# Patient Record
Sex: Male | Born: 1970 | Race: White | Hispanic: No | Marital: Married | Smoking: Never smoker
Health system: Southern US, Community
[De-identification: ages and names within clinical notes are randomized; demographics above are authoritative.]

## PROBLEM LIST (undated history)

## (undated) DIAGNOSIS — K219 Gastro-esophageal reflux disease without esophagitis: Secondary | ICD-10-CM

## (undated) DIAGNOSIS — T783XXA Angioneurotic edema, initial encounter: Secondary | ICD-10-CM

## (undated) DIAGNOSIS — K228 Other specified diseases of esophagus: Secondary | ICD-10-CM

## (undated) DIAGNOSIS — T18108A Unspecified foreign body in esophagus causing other injury, initial encounter: Secondary | ICD-10-CM

## (undated) DIAGNOSIS — E785 Hyperlipidemia, unspecified: Secondary | ICD-10-CM

## (undated) DIAGNOSIS — K200 Eosinophilic esophagitis: Secondary | ICD-10-CM

## (undated) DIAGNOSIS — T7840XA Allergy, unspecified, initial encounter: Secondary | ICD-10-CM

## (undated) DIAGNOSIS — E119 Type 2 diabetes mellitus without complications: Secondary | ICD-10-CM

## (undated) DIAGNOSIS — I10 Essential (primary) hypertension: Secondary | ICD-10-CM

## (undated) DIAGNOSIS — J302 Other seasonal allergic rhinitis: Secondary | ICD-10-CM

## (undated) HISTORY — PX: UPPER GASTROINTESTINAL ENDOSCOPY: SHX188

## (undated) HISTORY — DX: Hyperlipidemia, unspecified: E78.5

## (undated) HISTORY — DX: Essential (primary) hypertension: I10

## (undated) HISTORY — PX: KNEE ARTHROSCOPY: SUR90

## (undated) HISTORY — DX: Gastro-esophageal reflux disease without esophagitis: K21.9

## (undated) HISTORY — DX: Allergy, unspecified, initial encounter: T78.40XA

## (undated) HISTORY — DX: Other seasonal allergic rhinitis: J30.2

## (undated) HISTORY — DX: Angioneurotic edema, initial encounter: T78.3XXA

## (undated) HISTORY — DX: Type 2 diabetes mellitus without complications: E11.9

## (undated) HISTORY — DX: Eosinophilic esophagitis: K20.0

## (undated) HISTORY — DX: Other specified diseases of esophagus: K22.8

## (undated) HISTORY — PX: ESOPHAGEAL DILATION: SHX303

---

## 2003-09-11 ENCOUNTER — Ambulatory Visit (HOSPITAL_BASED_OUTPATIENT_CLINIC_OR_DEPARTMENT_OTHER): Admission: RE | Admit: 2003-09-11 | Discharge: 2003-09-11 | Payer: Self-pay | Admitting: Internal Medicine

## 2012-11-22 ENCOUNTER — Emergency Department (HOSPITAL_BASED_OUTPATIENT_CLINIC_OR_DEPARTMENT_OTHER)
Admission: EM | Admit: 2012-11-22 | Discharge: 2012-11-23 | Disposition: A | Discharge status: Home / Self Care | Payer: 59 | Attending: Emergency Medicine | Admitting: Emergency Medicine

## 2012-11-22 ENCOUNTER — Encounter (HOSPITAL_BASED_OUTPATIENT_CLINIC_OR_DEPARTMENT_OTHER): Payer: Self-pay | Admitting: Emergency Medicine

## 2012-11-22 ENCOUNTER — Encounter (HOSPITAL_COMMUNITY)
Admission: EM | Disposition: A | Discharge status: Home / Self Care | Payer: Self-pay | Source: Home / Self Care | Attending: Emergency Medicine

## 2012-11-22 DIAGNOSIS — IMO0002 Reserved for concepts with insufficient information to code with codable children: Secondary | ICD-10-CM | POA: Insufficient documentation

## 2012-11-22 DIAGNOSIS — W449XXA Unspecified foreign body entering into or through a natural orifice, initial encounter: Secondary | ICD-10-CM | POA: Diagnosis present

## 2012-11-22 DIAGNOSIS — T18108A Unspecified foreign body in esophagus causing other injury, initial encounter: Secondary | ICD-10-CM | POA: Insufficient documentation

## 2012-11-22 DIAGNOSIS — K200 Eosinophilic esophagitis: Secondary | ICD-10-CM | POA: Diagnosis present

## 2012-11-22 DIAGNOSIS — R1310 Dysphagia, unspecified: Secondary | ICD-10-CM | POA: Insufficient documentation

## 2012-11-22 HISTORY — DX: Unspecified foreign body entering into or through a natural orifice, initial encounter: W44.9XXA

## 2012-11-22 HISTORY — DX: Unspecified foreign body in esophagus causing other injury, initial encounter: T18.108A

## 2012-11-22 HISTORY — PX: ESOPHAGOGASTRODUODENOSCOPY: SHX5428

## 2012-11-22 LAB — CBC WITH DIFFERENTIAL/PLATELET
Basophils Absolute: 0.1 10*3/uL (ref 0.0–0.1)
Basophils Relative: 1 % (ref 0–1)
Hemoglobin: 15.7 g/dL (ref 13.0–17.0)
Lymphocytes Relative: 36 % (ref 12–46)
Lymphs Abs: 4 10*3/uL (ref 0.7–4.0)
MCHC: 34.3 g/dL (ref 30.0–36.0)
Monocytes Absolute: 1 10*3/uL (ref 0.1–1.0)
Monocytes Relative: 9 % (ref 3–12)
Neutro Abs: 5.4 10*3/uL (ref 1.7–7.7)
Neutrophils Relative %: 49 % (ref 43–77)
RDW: 13.3 % (ref 11.5–15.5)
WBC: 11 10*3/uL — ABNORMAL HIGH (ref 4.0–10.5)

## 2012-11-22 LAB — BASIC METABOLIC PANEL
Chloride: 104 mEq/L (ref 96–112)
Creatinine, Ser: 1.1 mg/dL (ref 0.50–1.35)
GFR calc Af Amer: >90 mL/min (ref >90)
Glucose, Bld: 107 mg/dL — ABNORMAL HIGH (ref 70–99)
Potassium: 3.7 mEq/L (ref 3.5–5.1)
Sodium: 141 mEq/L (ref 135–145)

## 2012-11-22 SURGERY — EGD (ESOPHAGOGASTRODUODENOSCOPY)
Anesthesia: Moderate Sedation

## 2012-11-22 MED ORDER — BUTAMBEN-TETRACAINE-BENZOCAINE 2-2-14 % EX AERO
INHALATION_SPRAY | CUTANEOUS | Status: DC | PRN
  Administered 2012-11-22: 1 via TOPICAL

## 2012-11-22 MED ORDER — GLUCAGON HCL (RDNA) 1 MG IJ SOLR
1.0000 mg | Freq: Once | INTRAMUSCULAR | Status: AC
  Administered 2012-11-22: 1 mg via INTRAVENOUS
  Filled 2012-11-22: qty 1

## 2012-11-22 MED ORDER — NITROGLYCERIN 0.4 MG SL SUBL
0.4000 mg | SUBLINGUAL_TABLET | SUBLINGUAL | Status: DC | PRN
  Administered 2012-11-22: 0.4 mg via SUBLINGUAL
  Filled 2012-11-22 (×2): qty 25

## 2012-11-22 MED ORDER — DIPHENHYDRAMINE HCL 50 MG/ML IJ SOLN
INTRAMUSCULAR | Status: AC
  Filled 2012-11-22: qty 1

## 2012-11-22 MED ORDER — FENTANYL CITRATE 0.05 MG/ML IJ SOLN
INTRAMUSCULAR | Status: AC
  Filled 2012-11-22: qty 2

## 2012-11-22 MED ORDER — ONDANSETRON HCL 4 MG/2ML IJ SOLN
4.0000 mg | Freq: Once | INTRAMUSCULAR | Status: AC
  Administered 2012-11-22: 4 mg via INTRAVENOUS
  Filled 2012-11-22: qty 2

## 2012-11-22 MED ORDER — SODIUM CHLORIDE 0.9 % IV BOLUS (SEPSIS)
500.0000 mL | Freq: Once | INTRAVENOUS | Status: AC
  Administered 2012-11-22: 500 mL via INTRAVENOUS

## 2012-11-22 MED ORDER — FENTANYL CITRATE 0.05 MG/ML IJ SOLN
INTRAMUSCULAR | Status: DC | PRN
  Administered 2012-11-22 (×2): 25 ug via INTRAVENOUS

## 2012-11-22 MED ORDER — SODIUM CHLORIDE 0.9 % IV SOLN
INTRAVENOUS | Status: DC
  Administered 2012-11-22: 500 mL via INTRAVENOUS

## 2012-11-22 MED ORDER — MIDAZOLAM HCL 10 MG/2ML IJ SOLN
INTRAMUSCULAR | Status: AC
  Filled 2012-11-22: qty 2

## 2012-11-22 MED ORDER — MIDAZOLAM HCL 10 MG/2ML IJ SOLN
INTRAMUSCULAR | Status: DC | PRN
  Administered 2012-11-22 (×3): 2 mg via INTRAVENOUS
  Administered 2012-11-22: 1 mg via INTRAVENOUS

## 2012-11-22 NOTE — ED Notes (Signed)
Pt states he has a piece of chicken stuck in his throat, no resp diff. Pt is able to talk in complete sentences.

## 2012-11-22 NOTE — ED Notes (Signed)
Pt reports feeling "like I'm choking" and vomited small amount of saliva and mucus. Reports choking sensation eased but "i still feel the chicken in there"

## 2012-11-22 NOTE — ED Notes (Signed)
Attempted to drink water. Pt immediately began to vomit.  Pt was unable to keep water down.  Pt reports it feels it moved up a little bit still has pressure in chest/esophagus area.

## 2012-11-22 NOTE — H&P (Signed)
  [facility] History and Physical   Reason for Procedure: Treat food impaction  Recommendations: EGD and removal of food obstructing esophagus - possible dilation     HPI: Nathan Ramsey is a 42 y.o. male with hx of prior esophageal dilation about 10 years ago. He has had intermittent dysphagia. Tonight has had a piece of chicken lodged in esophagus and unable to swallow - secretions or anything. Mild chest discomfort. Not on PPI.   Past Medical History  Diagnosis Date  . Distal esophageal obstruction due to food 11/22/2012    Past Surgical History  Procedure Laterality Date  . Esophageal dilation      Prior to Admission medications   Not on File    Current Facility-Administered Medications  Medication Dose Route Frequency Provider Last Rate Last Dose  . 0.9 %  sodium chloride infusion   Intravenous Continuous Iva Boop, MD 20 mL/hr at 11/22/12 2339 500 mL at 11/22/12 2339  . nitroGLYCERIN (NITROSTAT) SL tablet 0.4 mg  0.4 mg Sublingual Q5 min PRN [name], MD   0.4 mg at 11/22/12 2120    Allergies as of 11/22/2012  . (No Known Allergies)    History reviewed. No pertinent family history.  History   Social History  . Marital Status: Married    Spouse Name: N/A    Number of Children: N/A  . Years of Education: N/A   Occupational History  . Not on file.   Social History Main Topics  . Smoking status: Never Smoker   . Smokeless tobacco: Not on file  . Alcohol Use: No  . Drug Use: No     Review of Systems: All other review of systems negative except as mentioned in the HPI.  Physical Exam: Vital signs in last 24 hours: Temp:  [98.1 F (36.7 C)] 98.1 F (36.7 C) (10/12 2022) Pulse Rate:  [93-106] 93 (10/12 2212) Resp:  [18-20] 20 (10/12 2119) BP: (163-174)/(92-106) 163/101 mmHg (10/12 2212) SpO2:  [98 %-100 %] 98 % (10/12 2212) Weight:  [205 lb (92.987 kg)] 205 lb (92.987 kg) (10/12 2022)   General:   Alert,   Well-developed, well-nourished, pleasant and cooperative in NAD Eyes:  Sclera clear, no icterus.   Conjunctiva pink. Lungs:  Clear throughout to auscultation.   Heart:  Regular rate and rhythm; no murmurs, clicks, rubs,  or gallops. Abdomen:  Soft, nontender and nondistended. No masses, hepatosplenomegaly or hernias noted. Normal bowel sounds, without guarding, and without rebound.   Neurologic:  Alert and  oriented x 3 Psych:  Alert and cooperative. Normal mood and affect.     Lab Results:  Recent Labs  11/22/12 2032  WBC 11.0*  HGB 15.7  HCT 45.8  PLT 308   BMET  Recent Labs  11/22/12 2032  NA 141  K 3.7  CL 104  CO2 26  GLUCOSE 107*  BUN 13  CREATININE 1.10  CALCIUM 9.6       LOS: 0 days      @Carl  Sena Slate, MD, [facility] [phone] (pager) 11/22/2012 11:42 PM@

## 2012-11-22 NOTE — ED Provider Notes (Signed)
CSN: [id number]     Arrival date & time 11/22/12  2014 History  This chart was scribed for Nathan Crease, MD by Greggory Stallion, ED Scribe. This patient was seen in room MH02/MH02 and the patient's care was started at 8:25 PM.   Chief Complaint  Patient presents with  . Foreign Body   The history is provided by the patient. No language interpreter was used.   HPI Comments: Nathan Ramsey is a 42 y.o. male who presents to the Emergency Department complaining of a piece of chicken being stuck in his esophagus that occurred about one hour ago. He states this has happened before but he has been able to clear it out by drinking water. Pt states he has h/o acid reflux and has had dilation done 10 years ago. Pt denies trouble breathing and SOB.   History reviewed. No pertinent past medical history. History reviewed. No pertinent past surgical history. History reviewed. No pertinent family history. History  Substance Use Topics  . Smoking status: Never Smoker   . Smokeless tobacco: Not on file  . Alcohol Use: No    Review of Systems  Respiratory: Negative for shortness of breath.   All other systems reviewed and are negative.    Allergies  Review of patient's allergies indicates no known allergies.  Home Medications  No current outpatient prescriptions on file.  BP 167/106  Pulse 102  Temp(Src) 98.1 F (36.7 C) (Oral)  Resp 18  Ht 5\' 10"  (1.778 m)  Wt 205 lb (92.987 kg)  BMI 29.41 kg/m2  SpO2 98%  Physical Exam  Constitutional: He is oriented to person, place, and time. He appears well-developed and well-nourished. No distress.  HENT:  Head: Normocephalic and atraumatic.  Right Ear: Hearing normal.  Left Ear: Hearing normal.  Nose: Nose normal.  Mouth/Throat: Oropharynx is clear and moist and mucous membranes are normal.  Eyes: Conjunctivae and EOM are normal. Pupils are equal, round, and reactive to light.  Neck: Normal range of motion. Neck supple.   Cardiovascular: Regular rhythm, S1 normal and S2 normal.  Exam reveals no gallop and no friction rub.   No murmur heard. Pulmonary/Chest: Effort normal and breath sounds normal. No respiratory distress. He exhibits no tenderness.  Abdominal: Soft. Normal appearance and bowel sounds are normal. There is no hepatosplenomegaly. There is no tenderness. There is no rebound, no guarding, no tenderness at McBurney's point and negative Murphy's sign. No hernia.  Musculoskeletal: Normal range of motion.  Neurological: He is alert and oriented to person, place, and time. He has normal strength. No cranial nerve deficit or sensory deficit. Coordination normal. GCS eye subscore is 4. GCS verbal subscore is 5. GCS motor subscore is 6.  Skin: Skin is warm, dry and intact. No rash noted. No cyanosis.  Psychiatric: He has a normal mood and affect. His speech is normal and behavior is normal. Thought content normal.    ED Course  Procedures (including critical care time)  DIAGNOSTIC STUDIES: Oxygen Saturation is 98% on RA, normal by my interpretation.    COORDINATION OF CARE: 8:27 PM-Discussed treatment plan which includes labs with pt at bedside and pt agreed to plan.   Labs Review Labs Reviewed  CBC WITH DIFFERENTIAL - Abnormal; Notable for the following:    WBC 11.0 (*)    Eosinophils Relative 6 (*)    All other components within normal limits  BASIC METABOLIC PANEL - Abnormal; Notable for the following:    Glucose, Bld 107 (*)  GFR calc non Af Amer 81 (*)    All other components within normal limits   Imaging Review No results found.  EKG Interpretation   None       MDM  Diagnosis: Esophageal impaction  Patient presents to ER with a sensation of a piece of chicken stuck in his esophagus. Patient reports that he had this happened approximately 10 years ago. At that time he required an esophageal dilatation. He has been doing well up until about a year ago when he started noticing  having trouble swallowing. He says he has had to be careful to chew his food completely, but this is the first time he has had anything stuck. The patient was evaluated, no respiratory difficulty. He is unable to swallow liquids here in the ER. Patient was administered nitroglycerin and glucagon without improvement. He is still having difficulty swallowing liquids including his saliva. This therefore discussed with GI on call, Doctor Leone Payor. Patient will be transferred for endoscopy.   I personally performed the services described in this documentation, which was scribed in my presence. The recorded information has been reviewed and is accurate.    Nathan Crease, MD 11/22/12 2155

## 2012-11-23 ENCOUNTER — Encounter (HOSPITAL_COMMUNITY): Payer: Self-pay | Admitting: Internal Medicine

## 2012-11-23 DIAGNOSIS — K200 Eosinophilic esophagitis: Secondary | ICD-10-CM

## 2012-11-23 DIAGNOSIS — T18108A Unspecified foreign body in esophagus causing other injury, initial encounter: Secondary | ICD-10-CM

## 2012-11-23 HISTORY — DX: Eosinophilic esophagitis: K20.0

## 2012-11-23 NOTE — Op Note (Signed)
[facility] [address] [place], [address]   ENDOSCOPY PROCEDURE REPORT  PATIENT: Nathan Ramsey, Nathan Ramsey  MR#: [id number] BIRTHDATE: 1970/06/25 , 42  yrs. old GENDER: Male ENDOSCOPIST: Iva Boop, MD, [facility] PROCEDURE DATE:  11/22/2012 PROCEDURE:  EGD w/ biopsy and EGD w/ fb removal ASA CLASS:     Class I INDICATIONS:  Therapeutic procedure.  Remove food obstructing esophagus. MEDICATIONS: Fentanyl 50 mcg IV and Versed 7 mg IV TOPICAL ANESTHETIC: Cetacaine Spray  DESCRIPTION OF PROCEDURE: After the risks benefits and alternatives of the procedure were thoroughly explained, informed consent was obtained.  The    endoscope was introduced through the mouth and advanced to the stomach antrum. Without limitations.  The instrument was slowly withdrawn as the mucosa was fully examined.     ESOPHAGUS: Food impaction in distal esophagus.  It was removed with gentle manipulation of the scope and pressure - advancing it into the stomach.  Slight trauma to underlying mucosa.   Eosinophilic esophagitis was suspected  with mucosal changes that included circumferential folds, longitudinal furrows and white spots were found in the entire esophagus.   Biopsies taken.  The remainder of the upper endoscopy exam was otherwise normal. Retroflexed views revealed no abnormalities.     The scope was then withdrawn from the patient and the procedure completed.  COMPLICATIONS: There were no complications. ENDOSCOPIC IMPRESSION: 1.   Food impaction in distal esophagus.  It was removed with gentle manipulation of the scope and pressure - advancing it into the stomach.  Slight trauma to underlying mucosa. 2.   Eosinophilic esophagitis was suspectedin the entire esophagus 3.   The remainder of the upper endoscopy exam was otherwise normal  RECOMMENDATIONS: 1.  Await pathology results 2.  PPI qam - OTC is ok 3.  Office will call with results/plans   eSigned:  Iva Boop, MD,  [facility] 11/23/2012 12:08 AM  CC:The Patient

## 2012-11-27 ENCOUNTER — Other Ambulatory Visit: Payer: Self-pay | Admitting: *Deleted

## 2012-11-27 MED ORDER — PANTOPRAZOLE SODIUM 40 MG PO TBEC
DELAYED_RELEASE_TABLET | ORAL | Status: DC
Start: 2012-11-27 — End: 2018-08-06

## 2012-11-27 NOTE — Progress Notes (Signed)
Quick Note:  Call patient - he has eosinophilic esophagitis - this is causing swallowing problem Needs REV 1-2 weeks to review and discuss Tx please He is on/should be OTC PPI but please Rx pantoprazole 40 mg qd before breakfast #30 11 refills to replace ______

## 2012-12-02 ENCOUNTER — Ambulatory Visit (INDEPENDENT_AMBULATORY_CARE_PROVIDER_SITE_OTHER): Payer: 59 | Admitting: Internal Medicine

## 2012-12-02 ENCOUNTER — Encounter: Payer: Self-pay | Admitting: Internal Medicine

## 2012-12-02 VITALS — BP 134/90 | HR 80 | Ht 70.0 in | Wt 210.2 lb

## 2012-12-02 DIAGNOSIS — K200 Eosinophilic esophagitis: Secondary | ICD-10-CM

## 2012-12-02 MED ORDER — AMBULATORY NON FORMULARY MEDICATION: Status: DC

## 2012-12-02 NOTE — Assessment & Plan Note (Addendum)
Reviewed nature of disease and treatment Stay on PPI Start viscous budesonide 1 mg bid Allergy referral RTC 2 months Gradually increase size of foods a s he feels better

## 2012-12-02 NOTE — Progress Notes (Signed)
  Subjective:    Patient ID: Nathan Ramsey, male    DOB: [birth year], 42 y.o.   MRN: [id number]  HPI The patient presents in followup. I met him on call the other night, he had a food impaction. Biopsies of the esophagus demonstrated eosinophilic esophagitis. He is on a dysphagia 3 diet and doing reasonably well, I also started him on a PPI. He relates that he had an upper endoscopy with dilation in the past and had improvement, he was on Nexium. He lost a lot of weight, and stop the Nexium and did not have heartburn anymore. However he developed intermittent and recurrent and more progressive solid food dysphagia culminating into this food impaction problem about 2 weeks ago. He denies any history of overt allergies other than seasonal allergies. No Known Allergies Outpatient Prescriptions Prior to Visit  Medication Sig Dispense Refill  . pantoprazole (PROTONIX) 40 MG tablet Take one daily before breakfast  30 tablet  11  Over-the-counter allergy pill    Past Medical History  Diagnosis Date  . Distal esophageal obstruction due to food 11/22/2012  . Eosinophilic esophagitis 11/23/2012   seasonal allergies Past Surgical History  Procedure Laterality Date  . Esophageal dilation    . Esophagogastroduodenoscopy N/A 11/22/2012    Procedure: ESOPHAGOGASTRODUODENOSCOPY (EGD);  Surgeon: Iva Boop, MD;  Location: [facility];  Service: Endoscopy;  Laterality: N/A;   History   Social History  . Marital Status: Married with children             .     Social History Main Topics  . Smoking status: Never Smoker   . Smokeless tobacco: None  . Alcohol Use: 2.5 - 3 oz/week    5-6 drink(s) per week  . Drug Use: No  .       Review of Systems As per history of present illness    Objective:   Physical Exam  Well-developed well-nourished middle-aged white man in no acute distress    Assessment & Plan:   1. Eosinophilic esophagitis

## 2012-12-02 NOTE — Patient Instructions (Addendum)
Please come back and see [place] in 2 months.  Today we have faxed a rx to your [facility], [employer] at [phone].  (rx for Viscous budesonide)  You have an appointment with Dr. Laurette Schimke in [place], they will mail you the details.  The appointment is 12/10/12 at 9:30am.  Per there office you need to hold your allergy medicines for 72 hours prior to your appointment. They are located at :[address] , [place] , phone # [phone], fax # [phone]  I appreciate the opportunity to care for you.

## 2012-12-04 ENCOUNTER — Encounter: Payer: Self-pay | Admitting: Internal Medicine

## 2012-12-04 ENCOUNTER — Other Ambulatory Visit: Payer: Self-pay

## 2012-12-04 MED ORDER — AMBULATORY NON FORMULARY MEDICATION: Status: DC

## 2012-12-04 NOTE — Progress Notes (Signed)
Patient ID: Nathan Ramsey, male   DOB: Dec 16, 1970, 42 y.o.   MRN: [id number] Faxed 12/02/12 office note to Dr. Laurette Schimke at fax # [phone] for his up coming office visit with them on 01/01/13 at 9 AM

## 2012-12-04 NOTE — Telephone Encounter (Signed)
Pharmacy called to inquire about a dose change due to how they compound this medicine there.  Doug Sou, PA-C spoke with them and ok'ed the change .

## 2012-12-17 ENCOUNTER — Other Ambulatory Visit: Payer: Self-pay

## 2013-01-26 ENCOUNTER — Ambulatory Visit (INDEPENDENT_AMBULATORY_CARE_PROVIDER_SITE_OTHER): Payer: 59 | Admitting: Internal Medicine

## 2013-01-26 ENCOUNTER — Encounter: Payer: Self-pay | Admitting: Internal Medicine

## 2013-01-26 VITALS — BP 136/90 | HR 80 | Ht 70.0 in | Wt 210.0 lb

## 2013-01-26 DIAGNOSIS — T783XXA Angioneurotic edema, initial encounter: Secondary | ICD-10-CM

## 2013-01-26 DIAGNOSIS — K200 Eosinophilic esophagitis: Secondary | ICD-10-CM

## 2013-01-26 HISTORY — DX: Angioneurotic edema, initial encounter: T78.3XXA

## 2013-01-26 NOTE — Assessment & Plan Note (Signed)
I recommend he followup with Dr. Lucie Leather about this. He will call for an appointment.

## 2013-01-26 NOTE — Assessment & Plan Note (Addendum)
Better and asymptomatic To discontinue viscous budesonide and continue daily PPI Routine return visit with me in 1 year

## 2013-01-26 NOTE — Patient Instructions (Signed)
Glad to hear the swallowing is better. Please follow-up with Dr. Lucie Leather about the throat swelling episode and allergies.  I plan to see you routinely in about 1 year.  I appreciate the opportunity to care for you. Iva Boop, MD, [facility]

## 2013-01-26 NOTE — Progress Notes (Signed)
         Subjective:    Patient ID: Nathan Ramsey, male    DOB: 1970/05/10, 42 y.o.   MRN: [id number]  HPI The patient returns for followup. He has a history of eosinophilic esophagitis diagnosed to 3 months ago. Since that time he has not had dysphagia using viscous budesonide and PPI therapy. Allergy evaluation did not reveal any clear food allergies though he might be allergic to wheat he was told. A couple of weeks ago around Thanksgiving holiday he awakened and was unable to talk or breathe through his mouth so he was able to breathe through his nose some. He went to the hospital in [place] where he was thought to have angioedema similar one episode he had in the past. He had his EpiPen but since he could still breathe through his nose he held off, he was treated with epinephrine and Solu-Medrol and For about 24-36 hours in the hospital. There was a question of whether or not he might not have hereditary angioedema or C1 esterase deficiency and lab studies with complement studies he brings are normal. He was told that he probably need one other test and that the allergist there would coordinate with Dr. Lucie Leather dear. The patient has not heard about that.  Otherwise doing well, plans to return to  for the Christmas holidays with his son finishes his lacrosse. Medications, allergies, past medical history, past surgical history, family history and social history are reviewed and updated in the EMR.   Review of Systems As above    Objective:   Physical Exam Well-developed well-nourished no acute distress    Assessment & Plan:   1. Eosinophilic esophagitis   2. Angioedema   I appreciate the opportunity to care for this patient.  CC: Almedia Balls, MD and Jessica Priest M.D.

## 2013-02-15 ENCOUNTER — Encounter (HOSPITAL_COMMUNITY): Payer: Self-pay | Admitting: Anesthesiology

## 2013-08-09 ENCOUNTER — Other Ambulatory Visit: Payer: Self-pay | Admitting: Dermatology

## 2013-11-30 ENCOUNTER — Encounter: Payer: Self-pay | Admitting: Internal Medicine

## 2014-02-15 ENCOUNTER — Encounter: Payer: Self-pay | Admitting: Internal Medicine

## 2014-02-15 ENCOUNTER — Ambulatory Visit (INDEPENDENT_AMBULATORY_CARE_PROVIDER_SITE_OTHER): Payer: 59 | Admitting: Internal Medicine

## 2014-02-15 VITALS — BP 126/90 | HR 88 | Ht 68.5 in | Wt 207.0 lb

## 2014-02-15 DIAGNOSIS — K200 Eosinophilic esophagitis: Secondary | ICD-10-CM

## 2014-02-15 DIAGNOSIS — K219 Gastro-esophageal reflux disease without esophagitis: Secondary | ICD-10-CM

## 2014-02-15 MED ORDER — OMEPRAZOLE-SODIUM BICARBONATE 20-1100 MG PO CAPS: 1.0000 | ORAL_CAPSULE | Freq: Every day | ORAL | Status: DC

## 2014-02-15 NOTE — Patient Instructions (Signed)
Take over the counter zegerid one at bedtime.   In 2 months MyChart Dr Leone PayorGessner with how your doing.   Follow up with Dr Leone PayorGessner in a year or sooner if needed.   I appreciate the opportunity to care for you. Stan Headarl Gessner, M.D., [facility]

## 2014-02-15 NOTE — Progress Notes (Signed)
   Subjective:    Patient ID: Nathan Ramsey, male    DOB: 1970-11-23, 44 y.o.   MRN: [id number]  HPI Patient is here for follow-up of eosinophilic esophagitis and probable GERD. He is not having any true food dysphagia but has a sensation like things are filling up in the back of his throat at times he has to clear it out. Almost like a globus sensation but it doesn't sound completely like that. He is not having any heartburn or reflux. Sometimes there is a cough no sore throat and no hoarseness. This does not tend to disturb his sleep. He has not had these problems before as part of his symptom complex. He is not aware of any particular allergies, he was tested for food allergies before and didn't have those, he does use an over-the-counter antihistamine and has had a history of an angioedema episode and uses an EpiPen as needed though he has not needed to do that.  Medications, allergies, past medical history, past surgical history, family history and social history are reviewed and updated in the EMR. Review of Systems As above    Objective:   Physical Exam Well-developed WM NAD There is mild posterior pharyngeal erythema but no other lesions    Assessment & Plan:   1. Eosinophilic esophagitis   2. Gastroesophageal reflux disease, esophagitis presence not specified    I'm not sure what going on here, it could be a manifestation of GERD with the symptoms of something filling up in his throat. It does not sound like typical eosinophilic esophagitis symptoms. Perhaps he said some atypical GERD problems with proximal reflux.  1. Trial of over-the-counter Zegerid 20 mg at bedtime 2. Report back to me through phone call or my chart in 2 months with a symptom update 3. If persistent symptoms I think ENT or allergy follow-up would be necessary. He would like to go to [facility] because his daughter started going there for allergy shots if he needs to see an allergist again. He was  previously evaluated for food allergies but not environmental allergies.  I appreciate the opportunity to care for this patient. CC: CABEZA,YURI, MD

## 2014-04-20 ENCOUNTER — Encounter: Payer: Self-pay | Admitting: Internal Medicine

## 2014-04-21 ENCOUNTER — Telehealth: Payer: Self-pay

## 2014-04-21 NOTE — Telephone Encounter (Signed)
Patient is scheduled for 05/18/14 1:45 with Dr. Irena CordsVan Winkle.  He is notified of the appt details.

## 2014-04-21 NOTE — Telephone Encounter (Signed)
-----   Message from Iva Booparl E Gessner, MD sent at 04/20/2014  4:58 PM EST ----- Regarding: allergy referral Patient needs an allergy referral to [facility](van Winkle, Sharma, etc) his daughter sees Irena Cordsvan Winkle and he requests that practice  Reason is eosinophilic esophagitis, throat and nasal symptoms ? Allergies  He has sen Dr. Lucie LeatherKozlow before and had food allergy testing in case they ask.

## 2015-06-19 ENCOUNTER — Ambulatory Visit (INDEPENDENT_AMBULATORY_CARE_PROVIDER_SITE_OTHER): Payer: 59 | Admitting: Internal Medicine

## 2015-06-19 ENCOUNTER — Encounter: Payer: Self-pay | Admitting: Internal Medicine

## 2015-06-19 ENCOUNTER — Ambulatory Visit (INDEPENDENT_AMBULATORY_CARE_PROVIDER_SITE_OTHER)
Admission: RE | Admit: 2015-06-19 | Discharge: 2015-06-19 | Disposition: A | Discharge status: Home / Self Care | Payer: 59 | Source: Ambulatory Visit | Attending: Internal Medicine | Admitting: Internal Medicine

## 2015-06-19 VITALS — BP 142/100 | HR 104 | Ht 68.5 in | Wt 216.4 lb

## 2015-06-19 DIAGNOSIS — R05 Cough: Secondary | ICD-10-CM

## 2015-06-19 DIAGNOSIS — IMO0001 Reserved for inherently not codable concepts without codable children: Secondary | ICD-10-CM

## 2015-06-19 DIAGNOSIS — R053 Chronic cough: Secondary | ICD-10-CM

## 2015-06-19 DIAGNOSIS — R221 Localized swelling, mass and lump, neck: Secondary | ICD-10-CM

## 2015-06-19 DIAGNOSIS — R030 Elevated blood-pressure reading, without diagnosis of hypertension: Secondary | ICD-10-CM | POA: Diagnosis not present

## 2015-06-19 DIAGNOSIS — K200 Eosinophilic esophagitis: Secondary | ICD-10-CM | POA: Diagnosis not present

## 2015-06-19 NOTE — Patient Instructions (Signed)
   You have been scheduled for an endoscopy. Please follow written instructions given to you at your visit today. If you use inhalers (even only as needed), please bring them with you on the day of your procedure.    Today please go to the basement and have a chest x-ray done.    I appreciate the opportunity to care for you.

## 2015-06-19 NOTE — Progress Notes (Signed)
Subjective:    Patient ID: Nathan Ramsey, male    DOB: July 27, 1970, 45 y.o.   MRN: [id number] Cc; f/u eosinophilic esophagitis HPI Here after allergy appointment early 2017 - has a few c/o  1) chronic slightly productive cough - " my wife is very concerned" 2) sensation of lump in throat, ? Phlegm, no post nasal gtt sxs 3) dysphagia x 1 1.5 months ago otherwise very rare if at all Review of Systems Father in hospital in [place] - has had bleeding ulcer and Cushings syndrome  No Known Allergies Outpatient Prescriptions Prior to Visit  Medication Sig Dispense Refill  . AMBULATORY NON FORMULARY MEDICATION Medication Name: OTC allergy Medication (Allertec)-Take 1 tablet PO daily    . EPIPEN 2-PAK 0.3 MG/0.3ML SOAJ injection     . pantoprazole (PROTONIX) 40 MG tablet Take one daily before breakfast 30 tablet 11  . Omeprazole-Sodium Bicarbonate (ZEGERID OTC) 20-1100 MG CAPS capsule Take 1 capsule by mouth at bedtime. 28 each 0  . rosuvastatin (CRESTOR) 20 MG tablet Take 20 mg by mouth daily.     No facility-administered medications prior to visit.   Past Medical History  Diagnosis Date  . Distal esophageal obstruction due to food 11/22/2012  . Eosinophilic esophagitis 11/23/2012  . Seasonal allergies   . 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 Esophageal dilation    . Esophagogastroduodenoscopy N/A 11/22/2012    Procedure: ESOPHAGOGASTRODUODENOSCOPY (EGD);  Surgeon: Iva Boop, MD;  Location: [facility];  Service: Endoscopy;  Laterality: N/A;  . Knee arthroscopy     Social History   Social History  . Marital Status: Married    Spouse Name: N/A  . Number of Children: 2  . Years of Education: N/A   Occupational History  . IT    Social History Main Topics  . Smoking status: Never Smoker   . Smokeless tobacco: Never Used  . Alcohol Use: 2.5 - 3.0 oz/week    5-6 drink(s) per week  . Drug Use: No  . Sexual Activity: Not Asked    Other Topics Concern  . None   Social History Narrative   Family History  Problem Relation Age of Onset  . Kidney cancer Daughter   . Melanoma Paternal Uncle   . Breast cancer Mother   . Cushing syndrome Father       Objective:   Physical Exam  142/100 mmHg  Pulse 104  Ht 5' 8.5" (1.74 m)  Wt 216 lb 6.4 oz (98.158 kg)  BMI 32.42 kg/m2@  General:  NAD Eyes:   Anicteric Pharynx:  Erythema - slight posterior Lungs:  clear Heart:: S1S2 no rubs, murmurs or gallops Abdomen:  soft and nontender, BS+ Ext:   no edema, cyanosis or clubbing    Data Reviewed:  Prior EGD Recent allergy notes 02/2015    Assessment & Plan:   Encounter Diagnoses  Name Primary?  . Eosinophilic esophagitis Yes  . Chronic cough   . Sensation of lump in throat   . Elevated blood pressure     EGD, ? Dilation - dysphagia is rare - needs reassessment of EoE - has been treated with viscous budesonide and ingested steroid inhaler- not in some time - cough and lump unlikely to be due to EoE based upon what I know and experience w/ others - does have allergic rhinitis so ? If that involved though denies sig post-nasal gtt  The risks and benefits as well as alternatives of  endoscopic procedure(s) have been discussed and reviewed. All questions answered. The patient agrees to proceed. Plan bxs  CXR  ? Cyclical cough  BP up - recheck better - has had in past and better w/ weight loss - needs f/u PCP - he attributes ome to worry over father's hospitalization   I appreciate the opportunity to care for this patient. ZO:[id number],VWUJCc:CABEZA,YURI, MD Stefano Gaulhris van Winkle, MD

## 2015-06-22 NOTE — Progress Notes (Signed)
Quick Note:  OK Notice thru My Chart ______

## 2015-06-23 ENCOUNTER — Encounter: Payer: Self-pay | Admitting: Internal Medicine

## 2015-07-06 ENCOUNTER — Ambulatory Visit (AMBULATORY_SURGERY_CENTER): Payer: 59 | Admitting: Internal Medicine

## 2015-07-06 ENCOUNTER — Encounter: Payer: Self-pay | Admitting: Internal Medicine

## 2015-07-06 VITALS — BP 138/95 | HR 83 | Temp 98.6°F | Resp 16 | Ht 68.5 in | Wt 216.0 lb

## 2015-07-06 DIAGNOSIS — R1310 Dysphagia, unspecified: Secondary | ICD-10-CM | POA: Diagnosis not present

## 2015-07-06 DIAGNOSIS — K219 Gastro-esophageal reflux disease without esophagitis: Secondary | ICD-10-CM | POA: Diagnosis not present

## 2015-07-06 DIAGNOSIS — K200 Eosinophilic esophagitis: Secondary | ICD-10-CM | POA: Diagnosis not present

## 2015-07-06 MED ORDER — SODIUM CHLORIDE 0.9 % IV SOLN: 500.0000 mL | INTRAVENOUS | Status: DC

## 2015-07-06 NOTE — Op Note (Addendum)
[facility] Patient Name: Nathan Ramsey Procedure Date: 07/06/2015 3:29 PM MRN: [id number] Endoscopist: Iva Boop , MD Age: 45 Referring MD:  Date of Birth: 10/18/1970 Gender: Male Procedure:                Upper GI endoscopy Indications:              Dysphagia, Follow-up of eosinophilic esophagitis Medicines:                Propofol per Anesthesia, Monitored Anesthesia Care Procedure:                Pre-Anesthesia Assessment:                           - Prior to the procedure, a History and Physical                            was performed, and patient medications and                            allergies were reviewed. The patient's tolerance of                            previous anesthesia was also reviewed. The risks                            and benefits of the procedure and the sedation                            options and risks were discussed with the patient.                            All questions were answered, and informed consent                            was obtained. Prior Anticoagulants: The patient has                            taken no previous anticoagulant or antiplatelet                            agents. ASA Grade Assessment: II - A patient with                            mild systemic disease. After reviewing the risks                            and benefits, the patient was deemed in                            satisfactory condition to undergo the procedure.                           After obtaining informed consent, the endoscope was  passed under direct vision. Throughout the                            procedure, the patient's blood pressure, pulse, and                            oxygen saturations were monitored continuously. The                            Model GIF-HQ190 [id number] scope was introduced                            through the mouth, and advanced to the second part                            of  duodenum. The upper GI endoscopy was                            accomplished without difficulty. The patient                            tolerated the procedure well. Scope In: Scope Out: Findings:                 Mucosal changes including feline appearance and                            longitudinal furrows were found in the entire                            esophagus. Esophageal findings were graded using                            the Eosinophilic Esophagitis Endoscopic Reference                            Score (EoE-EREFS) as: Edema Grade 1 Present                            (decreased clarity or absence of vascular                            markings), Rings Grade 1 Mild (subtle                            circumferential ridges seen on esophageal                            distension), Exudates Grade 0 None (no white                            lesions seen), Furrows Grade 1 Present (vertical                            lines with or without visible depth) and  Stricture                            none (no stricture found). Biopsies were obtained                            from the proximal and distal esophagus with cold                            forceps for histology of suspected eosinophilic                            esophagitis. Estimated blood loss was minimal. The                            scope was withdrawn. Dilation was performed with a                            Maloney dilator with mild resistance at 50 Fr. The                            dilation site was examined following endoscope                            reinsertion and showed no change. Estimated blood                            loss: none.                           The exam was otherwise without abnormality.                           The cardia and gastric fundus were normal on                            retroflexion. Complications:            No immediate complications. Estimated Blood Loss:     Estimated blood  loss was minimal. Impression:               - Esophageal mucosal changes secondary to                            eosinophilic esophagitis. Biopsied. Dilated.                           - The examination was otherwise normal. Recommendation:           - Patient has a contact number available for                            emergencies. The signs and symptoms of potential                            delayed complications were discussed with the  patient. Return to normal activities tomorrow.                            Written discharge instructions were provided to the                            patient.                           - Clear liquids x 1 hour then soft foods rest of                            day. Start prior diet tomorrow.                           - Continue present medications.                           - Await pathology results. Iva Boop, MD 07/06/2015 3:46:18 PM This report has been signed electronically. Addendum Number: 1   Addendum Date: 07/06/2015 3:50:42 PM      cc:      Dr. Olivia Canter and Dr. Roselie Skinner Mamie Nick, MD 07/06/2015 3:51:04 PM This report has been signed electronically.

## 2015-07-06 NOTE — Patient Instructions (Addendum)
   I still saw some signs of eosinophilic esophagitis - took biopsies and dilated.  I will let you know results - likely next week.  I appreciate the opportunity to care for you. Iva Booparl E. Lynnley Doddridge, MD, FACG   YOU HAD AN ENDOSCOPIC PROCEDURE TODAY AT THE [facility]:   Refer to the procedure report that was given to you for any specific questions about what was found during the examination.  If the procedure report does not answer your questions, please call your gastroenterologist to clarify.  If you requested that your care partner not be given the details of your procedure findings, then the procedure report has been included in a sealed envelope for you to review at your convenience later.  YOU SHOULD EXPECT: Some feelings of bloating in the abdomen. Passage of more gas than usual.  Walking can help get rid of the air that was put into your GI tract during the procedure and reduce the bloating. If you had a lower endoscopy (such as a colonoscopy or flexible sigmoidoscopy) you may notice spotting of blood in your stool or on the toilet paper. If you underwent a bowel prep for your procedure, you may not have a normal bowel movement for a few days.  Please Note:  You might notice some irritation and congestion in your nose or some drainage.  This is from the oxygen used during your procedure.  There is no need for concern and it should clear up in a day or so.  SYMPTOMS TO REPORT IMMEDIATELY:   Following upper endoscopy (EGD)  Vomiting of blood or coffee ground material  New chest pain or pain under the shoulder blades  Painful or persistently difficult swallowing  New shortness of breath  Fever of 100F or higher  Black, tarry-looking stools  For urgent or emergent issues, a gastroenterologist can be reached at any hour by calling [phone].   DIET: NOTHING TO EAT OR DRINK UNTIL 4:30. 4:30 UNTIL 5:30 ONLY CLEAR LIQUIDS. AFTER 5:30 UNTIL THE MORNING ONLY SOFT  FOODS. RESUME YOUR DIET IN THE AM.   ACTIVITY:  You should plan to take it easy for the rest of today and you should NOT DRIVE or use heavy machinery until tomorrow (because of the sedation medicines used during the test).    FOLLOW UP: Our staff will call the number listed on your records the next business day following your procedure to check on you and address any questions or concerns that you may have regarding the information given to you following your procedure. If we do not reach you, we will leave a message.  However, if you are feeling well and you are not experiencing any problems, there is no need to return our call.  We will assume that you have returned to your regular daily activities without incident.  If any biopsies were taken you will be contacted by phone or by letter within the next 1-3 weeks.  Please call us at [phone] if you have not heard about the biopsies in 3 weeks.    SIGNATURES/CONFIDENTIALITY: You and/or your care partner have signed paperwork which will be entered into your electronic medical record.  These signatures attest to the fact that that the information above on your After Visit Summary has been reviewed and is understood.  Full responsibility of the confidentiality of this discharge information lies with you and/or your care-partner.

## 2015-07-06 NOTE — Progress Notes (Signed)
Called to room to assist during endoscopic procedure.  Patient ID and intended procedure confirmed with present staff. Received instructions for my participation in the procedure from the performing physician.  

## 2015-07-06 NOTE — Progress Notes (Signed)
Report to PACU, RN, vss, BBS= Clear.  

## 2015-07-07 ENCOUNTER — Telehealth: Payer: Self-pay | Admitting: *Deleted

## 2015-07-07 NOTE — Telephone Encounter (Signed)
  Follow up Call-  Call back number 07/06/2015  Post procedure Call Back phone  # #[phone] cell  Permission to leave phone message Yes     Patient questions:  Do you have a fever, pain , or abdominal swelling? No. Pain Score  0 *  Have you tolerated food without any problems? Yes.    Have you been able to return to your normal activities? Yes.    Do you have any questions about your discharge instructions: Diet   No. Medications  No. Follow up visit  No.  Do you have questions or concerns about your Care? No.  Actions: * If pain score is 4 or above: No action needed, pain <4.

## 2015-07-17 NOTE — Progress Notes (Signed)
Quick Note:  No letter/recall  My Chart message to patient ______

## 2015-07-19 ENCOUNTER — Encounter: Payer: Self-pay | Admitting: Internal Medicine

## 2017-02-14 ENCOUNTER — Ambulatory Visit
Admission: RE | Admit: 2017-02-14 | Discharge: 2017-02-14 | Disposition: A | Discharge status: Home / Self Care | Payer: 59 | Source: Ambulatory Visit | Attending: Allergy and Immunology | Admitting: Allergy and Immunology

## 2017-02-14 ENCOUNTER — Other Ambulatory Visit: Payer: Self-pay | Admitting: Allergy and Immunology

## 2017-02-14 DIAGNOSIS — R062 Wheezing: Secondary | ICD-10-CM

## 2017-05-07 IMAGING — DX DG CHEST 2V
2 series · 2 of 2 positions shown · non-contrast
Comparison: None in PACs

CLINICAL DATA: Two months of productive cough, nonsmoker.

EXAM:
CHEST  2 VIEW

[chest pa]
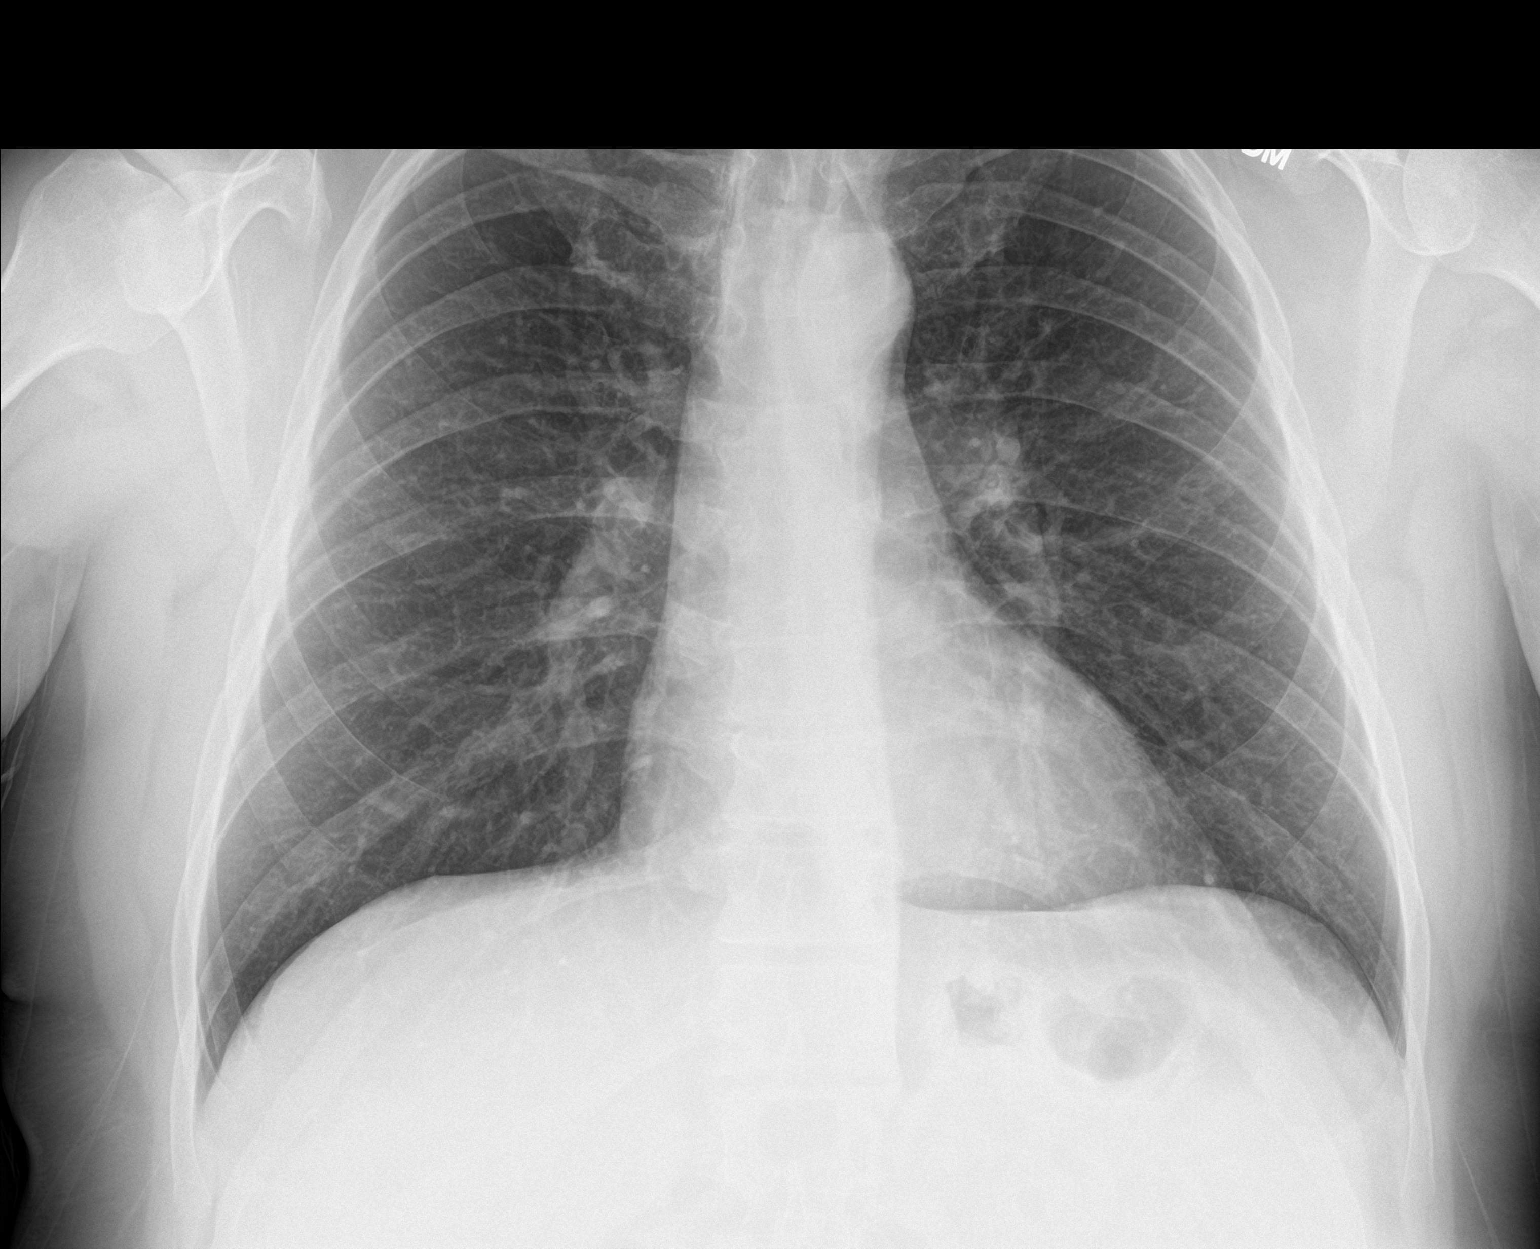

[chest lat]
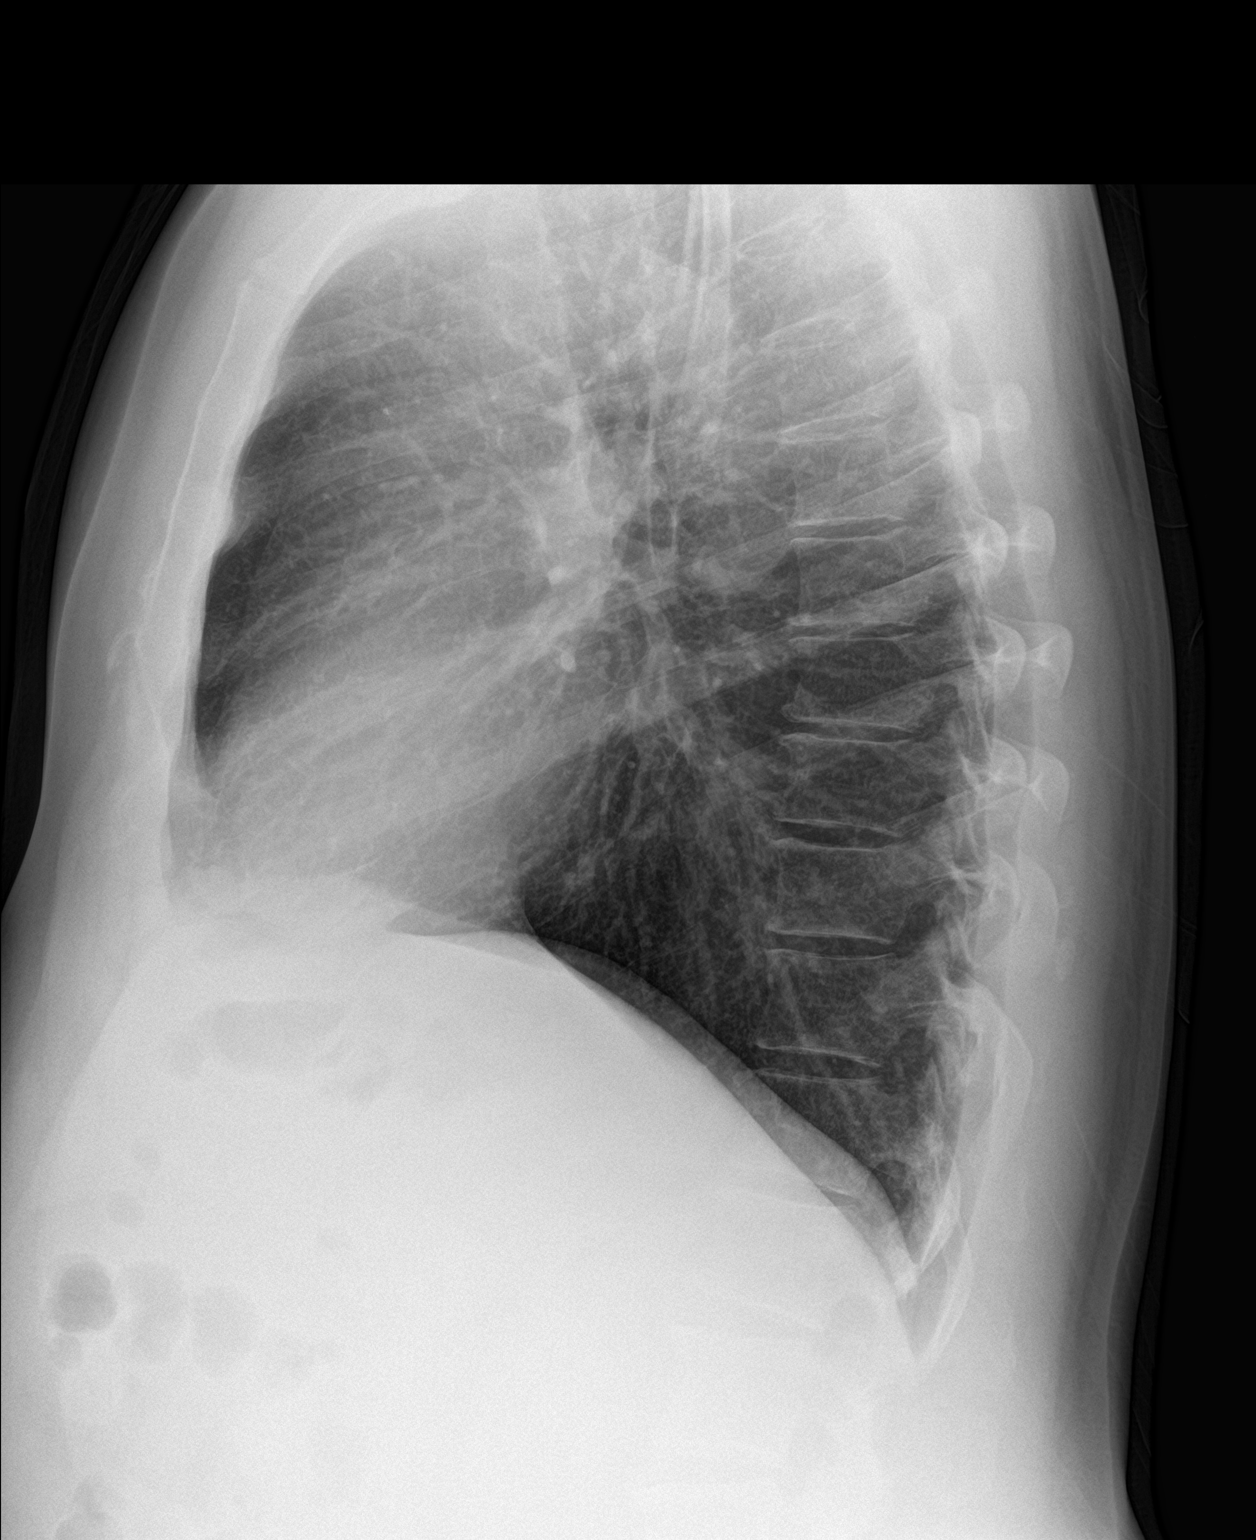

[2 of 2 positions shown; findings below may reference images not displayed]

FINDINGS: The lungs are well-expanded and clear. The heart and pulmonary
vascularity are normal. The mediastinum is normal in width. The
trachea is midline. The bony thorax exhibits no acute abnormality.
IMPRESSION: There is no active cardiopulmonary disease.

## 2018-01-17 ENCOUNTER — Encounter (HOSPITAL_BASED_OUTPATIENT_CLINIC_OR_DEPARTMENT_OTHER): Payer: Self-pay | Admitting: *Deleted

## 2018-01-17 ENCOUNTER — Emergency Department (HOSPITAL_BASED_OUTPATIENT_CLINIC_OR_DEPARTMENT_OTHER)
Admission: EM | Admit: 2018-01-17 | Discharge: 2018-01-18 | Disposition: A | Discharge status: Home / Self Care | Payer: Managed Care, Other (non HMO) | Attending: Emergency Medicine | Admitting: Emergency Medicine

## 2018-01-17 ENCOUNTER — Other Ambulatory Visit: Payer: Self-pay

## 2018-01-17 DIAGNOSIS — Z79899 Other long term (current) drug therapy: Secondary | ICD-10-CM | POA: Diagnosis not present

## 2018-01-17 DIAGNOSIS — R221 Localized swelling, mass and lump, neck: Secondary | ICD-10-CM | POA: Insufficient documentation

## 2018-01-17 DIAGNOSIS — R220 Localized swelling, mass and lump, head: Secondary | ICD-10-CM | POA: Diagnosis present

## 2018-01-17 DIAGNOSIS — T783XXA Angioneurotic edema, initial encounter: Secondary | ICD-10-CM | POA: Diagnosis not present

## 2018-01-17 MED ORDER — PREDNISONE 20 MG PO TABS: ORAL_TABLET | ORAL | 0 refills | Status: DC

## 2018-01-17 MED ORDER — METHYLPREDNISOLONE SODIUM SUCC 125 MG IJ SOLR
125.0000 mg | Freq: Once | INTRAMUSCULAR | Status: AC
  Administered 2018-01-17: 125 mg via INTRAVENOUS
  Filled 2018-01-17: qty 2

## 2018-01-17 MED ORDER — PREDNISONE 20 MG PO TABS
40.0000 mg | ORAL_TABLET | Freq: Once | ORAL | Status: AC
  Administered 2018-01-17: 40 mg via ORAL
  Filled 2018-01-17: qty 2

## 2018-01-17 NOTE — ED Triage Notes (Signed)
Pt reports throat swelling x 2 hours. He states he has had this in the past and is usually treated with steroids. He took 3 benadryl pta. States epi does not work. He sees allergist who wants bloodwork done during an attack. Speech clear, NAD at present

## 2018-01-17 NOTE — ED Notes (Signed)
Pt able to speak in clear sentences, no hives noted. Pt appears to be in NAD. No resp distress noted.

## 2018-01-17 NOTE — Discharge Instructions (Addendum)
1.  Follow-up with your doctor in the next week. 2.  Take 3 more days of prednisone starting tomorrow. 3.  Return to the emergency department if your symptoms recur.

## 2018-01-17 NOTE — ED Provider Notes (Signed)
[facility] EMERGENCY DEPARTMENT Provider Note   CSN: [id number] Arrival date & time: 01/17/18  2114     History   Chief Complaint Chief Complaint  Patient presents with  . Oral Swelling    HPI Nathan Ramsey is a 47 y.o. male.  HPI  Patient reports he has had a number of episodes in the past where he gets swelling either of his throat or his lips and tongue.  He reports however they do not happen together at the same time.  No trigger has been identified.  He is working with an Proofreaderallergist.  He reports he can feel the tightness and hoarseness in his throat and voice.  He reports this time he is not having any swelling of the tongue or the lips.  He reports usually this responds to steroids.  Patient already took Benadryl, 3 tablets prior to arrival.  He has an EpiPen but he reports that usually does not do anything for the symptoms.  He does not have any associated skin rash nausea vomiting abdominal pain or headache.  He did bring a slip with him with orders for tryptase and C1 esterase inhibitor to be drawn during an episode. Past Medical History:  Diagnosis Date  . Allergy   . Angioedema 01/26/2013  . Distal esophageal obstruction due to food 11/22/2012  . Eosinophilic esophagitis 11/23/2012  . Esophageal dilatation   . GERD (gastroesophageal reflux disease)   . Hyperlipidemia   . Seasonal allergies     Patient Active Problem List   Diagnosis Date Noted  . Angioedema 01/26/2013  . Eosinophilic esophagitis 11/23/2012    Past Surgical History:  Procedure Laterality Date  . ESOPHAGEAL DILATION    . ESOPHAGOGASTRODUODENOSCOPY N/A 11/22/2012   Procedure: ESOPHAGOGASTRODUODENOSCOPY (EGD);  Surgeon: Iva Booparl E Gessner, MD;  Location: [facility];  Service: Endoscopy;  Laterality: N/A;  . KNEE ARTHROSCOPY    . UPPER GASTROINTESTINAL ENDOSCOPY          Home Medications    Prior to Admission medications   Medication Sig Start Date End Date Taking? Authorizing  Provider  AMBULATORY NON FORMULARY MEDICATION Medication Name: OTC allergy Medication (Allertec)-Take 1 tablet PO daily    [provider]  atorvastatin (LIPITOR) 20 MG tablet Take 20 mg by mouth. 02/23/15   [provider]  EPIPEN 2-PAK 0.3 MG/0.3ML SOAJ injection Reported on 07/06/2015 12/10/12   [provider]  pantoprazole (PROTONIX) 40 MG tablet Take one daily before breakfast 11/27/12   Iva BoopGessner, Carl E, MD  predniSONE (DELTASONE) 20 MG tablet 2 tabs po daily x 3 days 01/17/18   Arby BarrettePfeiffer, Thompson Mckim, MD    Family History Family History  Problem Relation Age of Onset  . Breast cancer Mother   . Cushing syndrome Father   . Kidney cancer Daughter   . Melanoma Paternal Uncle   . Colon cancer Neg Hx   . Esophageal cancer Neg Hx   . Pancreatic cancer Neg Hx   . Prostate cancer Neg Hx   . Rectal cancer Neg Hx   . Stomach cancer Neg Hx     Social History Social History   Tobacco Use  . Smoking status: Never Smoker  . Smokeless tobacco: Current User    Types: Snuff  Substance Use Topics  . Alcohol use: Yes    Alcohol/week: 12.0 - 13.0 standard drinks    Types: 7 Cans of beer, 5 - 6 Standard drinks or equivalent per week  . Drug use: No  Allergies   Patient has no known allergies.   Review of Systems Review of Systems 10 Systems reviewed and are negative for acute change except as noted in the HPI.   Physical Exam Updated Vital Signs BP (!) 153/94   Pulse 91   Temp 98 F (36.7 C) (Oral)   Resp 18   Ht 5\' 10"  (1.778 m)   Wt 93 kg   SpO2 97%   BMI 29.41 kg/m   Physical Exam  Constitutional: He is oriented to person, place, and time. He appears well-developed and well-nourished. No distress.  HENT:  Head: Normocephalic and atraumatic.  Nose: Nose normal.  Mouth/Throat: Oropharynx is clear and moist.  Patient does not objectively have any swelling of the lips or oral mucosa.  Posterior airway is widely patent.  Voice has slight  hoarseness to it.  No stridor no difficulty breathing or swallowing.  Eyes: Pupils are equal, round, and reactive to light. Conjunctivae and EOM are normal.  Neck: Neck supple.  Cardiovascular: Normal rate, regular rhythm and normal heart sounds.  Pulmonary/Chest: Effort normal and breath sounds normal.  Abdominal: Soft. He exhibits no distension. There is no tenderness. There is no guarding.  Musculoskeletal: Normal range of motion. He exhibits no edema.  Lymphadenopathy:    He has no cervical adenopathy.  Neurological: He is alert and oriented to person, place, and time. He exhibits normal muscle tone. Coordination normal.  Skin: Skin is warm and dry. No rash noted.  Psychiatric: He has a normal mood and affect.     ED Treatments / Results  Labs (all labs ordered are listed, but only abnormal results are displayed) Labs Reviewed  TRYPTASE  C1 ESTERASE INHIBITOR    EKG None  Radiology No results found.  Procedures Procedures (including critical care time)  Medications Ordered in ED Medications  methylPREDNISolone sodium succinate (SOLU-MEDROL) 125 mg/2 mL injection 125 mg (125 mg Intravenous Given 01/17/18 2202)  predniSONE (DELTASONE) tablet 40 mg (40 mg Oral Given 01/17/18 2336)     Initial Impression / Assessment and Plan / ED Course  I have reviewed the triage vital signs and the nursing notes.  Pertinent labs & imaging results that were available during my care of the patient were reviewed by me and considered in my medical decision making (see chart for details).    Patient symptoms at presentation are fairly mild.  He does not have any objective visible swelling of the anterior or posterior airway.  Voice does have a slightly hoarse quality but no stridor or difficulty breathing.  Patient reports this has happened on numerous occasions and it is unknown what the causes.  He had already taken Benadryl.  He is given Solu-Medrol based on his description of steroids  being most efficacious for symptoms.  Is kept under observation and there was no worsening or advancing of symptoms.  Patient denies he is ever had to be intubated before.  At this time I feel he is stable for discharge.  We have obtained to the tryptase and C1 esterase inhibitor labs as requested.  Final Clinical Impressions(s) / ED Diagnoses   Final diagnoses:  Throat swelling  Angioedema, initial encounter    ED Discharge Orders         Ordered    predniSONE (DELTASONE) 20 MG tablet     01/17/18 2348           Arby Barrette, MD 01/17/18 2356

## 2018-01-20 LAB — TRYPTASE: TRYPTASE: 7.4 ug/L (ref 2.2–13.2)

## 2018-01-20 LAB — C1 ESTERASE INHIBITOR: C1INH SerPl-mCnc: 28 mg/dL (ref 21–39)

## 2018-08-06 ENCOUNTER — Encounter: Payer: Self-pay | Admitting: Internal Medicine

## 2018-08-06 ENCOUNTER — Ambulatory Visit (INDEPENDENT_AMBULATORY_CARE_PROVIDER_SITE_OTHER): Payer: BC Managed Care – PPO | Admitting: Internal Medicine

## 2018-08-06 DIAGNOSIS — K219 Gastro-esophageal reflux disease without esophagitis: Secondary | ICD-10-CM

## 2018-08-06 DIAGNOSIS — K200 Eosinophilic esophagitis: Secondary | ICD-10-CM

## 2018-08-06 DIAGNOSIS — R05 Cough: Secondary | ICD-10-CM

## 2018-08-06 DIAGNOSIS — R0989 Other specified symptoms and signs involving the circulatory and respiratory systems: Secondary | ICD-10-CM

## 2018-08-06 DIAGNOSIS — R053 Chronic cough: Secondary | ICD-10-CM

## 2018-08-06 DIAGNOSIS — R6889 Other general symptoms and signs: Secondary | ICD-10-CM | POA: Insufficient documentation

## 2018-08-06 DIAGNOSIS — T783XXS Angioneurotic edema, sequela: Secondary | ICD-10-CM

## 2018-08-06 MED ORDER — DEXILANT 60 MG PO CPDR: 60.0000 mg | DELAYED_RELEASE_CAPSULE | Freq: Every day | ORAL | 3 refills | Status: DC

## 2018-08-06 NOTE — Assessment & Plan Note (Signed)
No dysphagia, this seems under control.

## 2018-08-06 NOTE — Assessment & Plan Note (Addendum)
It is possible he has LPR.  His chronic throat clearing and coughing and postnasal drip etc. fit with that but they could also be primary causes i.e. the postnasal drip and allergies.  I have explained this can be very hard to sort out and he understands.  I do not think his symptoms related to this are due to his eosinophilic esophagitis.  He needs a higher dose and more frequent PPI administration to treat if this is LPR.  His new primary care provider was trying to wean him from PPI and I must assume that they were under the mistaken impression that PPIs are not helpful for this or more likely concerned about the reported associations with dementia, bone fractures kidney failure etc.  This is a common misconception even among physicians unfortunately.  So, I am going to put him on Dexilant 60 mg daily.  That has a delayed release of part of the medication so it is like taking a twice daily PPI and fortunately it is on his formulary.  I want to regroup with him in about 2 months.  I will give him some simple instructions on trying to suppress throat clearing and coughing etc.  He may need an ENT evaluation at some point.  If he has persistent problems on Dexilant one option would be to perform esophageal manometry and 24-hour pH impedance on medication.

## 2018-08-06 NOTE — Assessment & Plan Note (Addendum)
I called him back about this but he said he had just started taking that a few months ago and these problems definitely predate that.  HoweverI do not think this is related to his GI issues.  It is a curious problem, sounds like he has had a thorough serologic work-up and potential triggers evaluated which is been unhelpful unfortunately.  After I got off the AV call with him I did double check and it appears amlodipine is associated with angioedema.  However that was a recent addition to his medication regimen and his angioedema goes back years.

## 2018-08-06 NOTE — Progress Notes (Signed)
TELEHEALTH ENCOUNTER IN SETTING OF COVID-19 PANDEMIC - REQUESTED BY PATIENT SERVICE PROVIDED BY TELEMEDECINE - TYPE: [employer] AV PATIENT LOCATION: Home PATIENT HAS CONSENTED TO TELEHEALTH VISIT PROVIDER LOCATION: OFFICE REFERRING PROVIDER:[name], MD PARTICIPANTS OTHER THAN PATIENT:none TIME SPENT ON CALL:16 mins    Nathan Ramsey 48 y.o. 1971/02/01 [id number]  Assessment & Plan:   Encounter Diagnoses  Name Primary?  . Laryngopharyngeal reflux (LPR)?   Marland Kitchen. Chronic cough   . Chronic throat clearing   . Eosinophilic esophagitis   . Angioedema, sequela      Laryngopharyngeal reflux (LPR)? It is possible he has LPR.  His chronic throat clearing and coughing and postnasal drip etc. fit with that but they could also be primary causes i.e. the postnasal drip and allergies.  I have explained this can be very hard to sort out and he understands.  I do not think his symptoms related to this are due to his eosinophilic esophagitis.  He needs a higher dose and more frequent PPI administration to treat if this is LPR.  His new primary care provider was trying to wean him from PPI and I must assume that they were under the mistaken impression that PPIs are not helpful for this or more likely concerned about the reported associations with dementia, bone fractures kidney failure etc.  This is a common misconception even among physicians unfortunately.  So, I am going to put him on Dexilant 60 mg daily.  That has a delayed release of part of the medication so it is like taking a twice daily PPI and fortunately it is on his formulary.  I want to regroup with him in about 2 months.  I will give him some simple instructions on trying to suppress throat clearing and coughing etc.  He may need an ENT evaluation at some point.  If he has persistent problems on Dexilant one option would be to perform esophageal manometry and 24-hour pH impedance on medication.  Eosinophilic esophagitis No  dysphagia, this seems under control.  Angioedema I called him back about this but he said he had just started taking that a few months ago and these problems definitely predate that.  HoweverI do not think this is related to his GI issues.  It is a curious problem, sounds like he has had a thorough serologic work-up and potential triggers evaluated which is been unhelpful unfortunately.  After I got off the AV call with him I did double check and it appears amlodipine is associated with angioedema.  However that was a recent addition to his medication regimen and his angioedema goes back years.   I will copy Dr. Leana Roehris Vanwinkle Subjective:   Chief Complaint: Cough throat clearing question LPR  HPI The patient is a 48 year old white man I have followed for eosinophilic esophagitis, who is complaining of a postprandial cough a lot of throat clearing coughing with laughing and a lump in his throat at times.  He has allergy problems, these are followed and treated by Dr. Madie RenoVanwinkle.  He has been having intermittent mysterious angioedema that starts with a sore throat on the right and extends over to the left with swelling in the mouth he had just lip swelling as well.  Extensive testing by Dr. Madie RenoVanwinkle has been unrevealing.  He might have a bit of a wheat allergy so he has been reducing wheat.  The angioedema does not respond to epinephrine but will respond to high-dose antihistamines and when he gets severe some  Solu-Medrol makes a difference, at an ER visit.  There is no dysphagia.  That is under control and his last EGD showed resolution of the mucosal changes on biopsies of eosinophilic esophagitis.  He was dilated at that time he has not had dysphagia since.  Recently his PCP left so he saw a new woman who tried to reduce his PPI to every other day in an attempt to have him come off of that.  When he saw Dr. Fredderick Phenix with these complaints of phlegm, globus, postnasal drip, cough and throat clearing he  put him back on 40 mg daily.  C1 esterase inhibitor and tryptase have been normal.  His breathing is been okay.  He does not seem to have problems breathing when he gets this angioedema in fact he can breathe through his nose without difficulty. No Known Allergies Current Meds  Medication Sig  . AMBULATORY NON FORMULARY MEDICATION Medication Name: OTC allergy Medication (Allertec)-Take 1 tablet PO daily  . amLODipine (NORVASC) 10 MG tablet Take 10 mg by mouth daily.  Marland Kitchen atorvastatin (LIPITOR) 40 MG tablet Take 40 mg by mouth daily.  . cetirizine (ZYRTEC) 10 MG tablet Take 10 mg by mouth 2 (two) times daily.  Marland Kitchen EPIPEN 2-PAK 0.3 MG/0.3ML SOAJ injection Reported on 07/06/2015  . escitalopram (LEXAPRO) 10 MG tablet Take 10 mg by mouth daily.  . hydrochlorothiazide (HYDRODIURIL) 25 MG tablet Take 25 mg by mouth daily.  . pantoprazole (PROTONIX) 40 MG tablet Take one daily before breakfast   Past Medical History:  Diagnosis Date  . Allergy   . Angioedema 01/26/2013  . Distal esophageal obstruction due to food 11/22/2012  . Eosinophilic esophagitis 16/11/9602  . GERD (gastroesophageal reflux disease)   . Hyperlipidemia   . Seasonal allergies    Past Surgical History:  Procedure Laterality Date  . ESOPHAGEAL DILATION    . ESOPHAGOGASTRODUODENOSCOPY N/A 11/22/2012   Procedure: ESOPHAGOGASTRODUODENOSCOPY (EGD);  Surgeon: Gatha Mayer, MD;  Location: [facility];  Service: Endoscopy;  Laterality: N/A;  . KNEE ARTHROSCOPY    . UPPER GASTROINTESTINAL ENDOSCOPY     Social History   Social History Narrative   Married, 2 kids   12 drinks/week   No smoking, vaping, drugs   family history includes Breast cancer in his mother; Kidney cancer in his daughter; Melanoma in his paternal uncle.   Review of Systems See see HPI for GI, ENT respiratory  Physical assessment through the A/V link shows a well-developed well-nourished white man in no acute distress.  His speech is clear.  The eyes  appear nonicteric.  Nonlabored respiratory effort.  Alert noted x3 with an appropriate mood and affect.

## 2018-08-06 NOTE — Patient Instructions (Signed)
As we discussed, discontinue pantoprazole and start [place].  Take the Dexilant every day before breakfast about 30 minutes or so.  Let see if that does not improve your symptoms of cough, throat clearing, drainage.  If you have LPR then it should though that is not an iron clad guarantee.   Please contact us in about early to mid July to try to get an August appointment.  I would like to see you towards the end of August.  About 2 months.  Some simple things to try to reduce throat clearing and cough are as follows: Swallowing water and/or using ice chips/non mint and menthol containing candies (such as lifesavers or jolly ranchers) are also effective.  You should rest your voice and avoid activities that you know make you cough.  I appreciate the opportunity to care for you. Gatha Mayer, MD, [facility]

## 2018-10-29 ENCOUNTER — Ambulatory Visit: Payer: BC Managed Care – PPO | Admitting: Internal Medicine

## 2018-10-29 ENCOUNTER — Other Ambulatory Visit: Payer: Self-pay

## 2018-10-29 ENCOUNTER — Encounter: Payer: Self-pay | Admitting: Internal Medicine

## 2018-10-29 ENCOUNTER — Other Ambulatory Visit (HOSPITAL_COMMUNITY): Payer: Self-pay

## 2018-10-29 VITALS — BP 136/84 | HR 84 | Temp 97.8°F | Ht 70.0 in | Wt 225.0 lb

## 2018-10-29 DIAGNOSIS — R053 Chronic cough: Secondary | ICD-10-CM

## 2018-10-29 DIAGNOSIS — R0989 Other specified symptoms and signs involving the circulatory and respiratory systems: Secondary | ICD-10-CM

## 2018-10-29 DIAGNOSIS — R1310 Dysphagia, unspecified: Secondary | ICD-10-CM

## 2018-10-29 DIAGNOSIS — K200 Eosinophilic esophagitis: Secondary | ICD-10-CM

## 2018-10-29 DIAGNOSIS — J209 Acute bronchitis, unspecified: Secondary | ICD-10-CM | POA: Diagnosis not present

## 2018-10-29 DIAGNOSIS — R05 Cough: Secondary | ICD-10-CM

## 2018-10-29 DIAGNOSIS — R6889 Other general symptoms and signs: Secondary | ICD-10-CM

## 2018-10-29 DIAGNOSIS — K219 Gastro-esophageal reflux disease without esophagitis: Secondary | ICD-10-CM | POA: Diagnosis not present

## 2018-10-29 MED ORDER — PREDNISONE 20 MG PO TABS: 20.0000 mg | ORAL_TABLET | Freq: Every day | ORAL | 0 refills | Status: AC

## 2018-10-29 MED ORDER — AZITHROMYCIN 250 MG PO TABS: 250.0000 mg | ORAL_TABLET | Freq: Every day | ORAL | 0 refills | Status: AC

## 2018-10-29 NOTE — Assessment & Plan Note (Addendum)
As per LPR and chronic cough plan

## 2018-10-29 NOTE — Progress Notes (Signed)
Nathan Ramsey 48 y.o. [birth year] [id number]  Assessment & Plan:   Chronic cough Evaluate with modified barium swallow.  He may need manometry and pH impedance testing.  He is aware.  Laryngopharyngeal reflux (LPR)? Stay on PPI Modified barium swallow and consider additional work-up.  The fact that lying on his left side greatly helps things suggest to me we may be blocking acid but not fixing his reflux.  Question if he would be a TIF candidate in the setting of eosinophilic esophagitis.  He was in remission in 2017.  Consideration of that certainly may require another EGD but not now.   Chronic throat clearing As per LPR and chronic cough plan  Eosinophilic esophagitis I think this is in remission no dysphagia and negative biopsies 2017  Acute bronchitis Z-Pak and 5 days of prednisone that helped him in the past     Subjective:   Chief Complaint: Follow-up of cough reflux, LPR  HPI Nathan Ramsey reports that since going on Dexilant for suspected LPR he has had some improvement but really still has problems.  Especially at night when he lies down.  He has been sleeping on the sofa and trying to use pillows without bending at the waist but still has fluid and reflux "coming up".  If he lies on his left side he is clearly a lot better he says.  Even lying on the right is better than being on his back.  Not so much postnasal drip.  He has coughing soda during and after eating.  Throat clearing persists.  Again slight improvement.  No true classic heartburn per se.  He reports that in August he had a rattle and has another 1, he had a Z-Pak and 5 days of prednisone at 20 mg which greatly helped him and he is concerned he is headed in that direction again and is requesting a refill of that regimen.  No fevers.  His 11th grade son has been moved to a different school where they are masking.  He has a daughter younger who had a Wilms tumor and they were concerned that the school he was going  to that were not using masks and they are very pleased.  He is always worked from home and his wife is now working from home.  Not affected by COVID otherwise.  He is following the appropriate precautions. No Known Allergies Current Meds  Medication Sig  . AMBULATORY NON FORMULARY MEDICATION Medication Name: OTC allergy Medication (Allertec)-Take 1 tablet PO daily  . amLODipine (NORVASC) 10 MG tablet Take 10 mg by mouth daily.  Marland Kitchen. atorvastatin (LIPITOR) 40 MG tablet Take 40 mg by mouth daily.  . cetirizine (ZYRTEC) 10 MG tablet Take 10 mg by mouth 2 (two) times daily.  Marland Kitchen. dexlansoprazole (DEXILANT) 60 MG capsule Take 1 capsule (60 mg total) by mouth daily.  Marland Kitchen. EPIPEN 2-PAK 0.3 MG/0.3ML SOAJ injection Reported on 07/06/2015  . escitalopram (LEXAPRO) 10 MG tablet Take 10 mg by mouth daily.  . hydrochlorothiazide (HYDRODIURIL) 25 MG tablet Take 25 mg by mouth daily.   Past Medical History:  Diagnosis Date  . Allergy   . Angioedema 01/26/2013  . Distal esophageal obstruction due to food 11/22/2012  . Eosinophilic esophagitis 11/23/2012  . GERD (gastroesophageal reflux disease)   . Hyperlipidemia   . Seasonal allergies    Past Surgical History:  Procedure Laterality Date  . ESOPHAGEAL DILATION    . ESOPHAGOGASTRODUODENOSCOPY N/A 11/22/2012   Procedure: ESOPHAGOGASTRODUODENOSCOPY (EGD);  Surgeon: Baldo Asharl  Simonne Maffucci, MD;  Location: [facility];  Service: Endoscopy;  Laterality: N/A;  . KNEE ARTHROSCOPY    . UPPER GASTROINTESTINAL ENDOSCOPY     Social History   Social History Narrative   Married, 2 kids   12 drinks/week   No smoking, vaping, drugs   family history includes Breast cancer in his mother; Kidney cancer in his daughter; Melanoma in his paternal uncle.   Review of Systems   Objective:   Physical Exam BP 136/84   Pulse 84   Temp 97.8 F (36.6 C) (Temporal)   Ht 5\' 10"  (1.778 m)   Wt 225 lb (102.1 kg)   BMI 32.28 kg/m  NAD Eyes anicteric Pharynx clear Chest  scattered wheezes

## 2018-10-29 NOTE — Assessment & Plan Note (Addendum)
Evaluate with modified barium swallow.  He may need manometry and pH impedance testing.  He is aware.

## 2018-10-29 NOTE — Assessment & Plan Note (Signed)
I think this is in remission no dysphagia and negative biopsies 2017

## 2018-10-29 NOTE — Assessment & Plan Note (Addendum)
Stay on PPI Modified barium swallow and consider additional work-up.  The fact that lying on his left side greatly helps things suggest to me we may be blocking acid but not fixing his reflux.  Question if he would be a TIF candidate in the setting of eosinophilic esophagitis.  He was in remission in 2017.  Consideration of that certainly may require another EGD but not now.

## 2018-10-29 NOTE — Assessment & Plan Note (Signed)
Z-Pak and 5 days of prednisone that helped him in the past

## 2018-10-29 NOTE — Patient Instructions (Signed)
You have been scheduled for a modified barium swallow on 11/03/2018 at 11:30AM. Please arrive 15 minutes prior to your test for registration. You will go to [facility] (1st Floor) for your appointment. Should you need to cancel or reschedule your appointment, please contact [phone] [facility]) or [phone] [place]). _____________________________________________________________________ A Modified Barium Swallow Study, or MBS, is a special x-ray that is taken to check swallowing skills. It is carried out by a Stage manager and a Psychologist, clinical (SLP). During this test, yourmouth, throat, and esophagus, a muscular tube which connects your mouth to your stomach, is checked. The test will help you, your doctor, and the SLP plan what types of foods and liquids are easier for you to swallow. The SLP will also identify positions and ways to help you swallow more easily and safely. What will happen during an MBS? You will be taken to an x-ray room and seated comfortably. You will be asked to swallow small amounts of food and liquid mixed with barium. Barium is a liquid or paste that allows images of your mouth, throat and esophagus to be seen on x-ray. The x-ray captures moving images of the food you are swallowing as it travels from your mouth through your throat and into your esophagus. This test helps identify whether food or liquid is entering your lungs (aspiration). The test also shows which part of your mouth or throat lacks strength or coordination to move the food or liquid in the right direction. This test typically takes 30 minutes to 1 hour to complete. _______________________________________________________________________  We have sent the following medications to your pharmacy for you to pick up at your convenience: z-pak , prednisone  I appreciate the opportunity to care for you. Silvano Rusk, MD, [facility]

## 2018-11-03 ENCOUNTER — Other Ambulatory Visit: Payer: Self-pay

## 2018-11-03 ENCOUNTER — Ambulatory Visit (HOSPITAL_COMMUNITY)
Admission: RE | Admit: 2018-11-03 | Discharge: 2018-11-03 | Disposition: A | Discharge status: Home / Self Care | Payer: BC Managed Care – PPO | Source: Ambulatory Visit | Attending: Internal Medicine | Admitting: Internal Medicine

## 2018-11-03 DIAGNOSIS — R05 Cough: Secondary | ICD-10-CM

## 2018-11-03 DIAGNOSIS — R1310 Dysphagia, unspecified: Secondary | ICD-10-CM | POA: Diagnosis not present

## 2018-11-03 DIAGNOSIS — K219 Gastro-esophageal reflux disease without esophagitis: Secondary | ICD-10-CM

## 2018-11-03 DIAGNOSIS — R053 Chronic cough: Secondary | ICD-10-CM

## 2018-11-03 NOTE — Progress Notes (Signed)
Modified Barium Swallow Progress Note  Patient Details  Name: Nathan Ramsey MRN: [id number] Date of Birth: 05-Jun-1970  Today's Date: 11/03/2018  Modified Barium Swallow completed.  Full report located under Chart Review in the Imaging Section.  Brief recommendations include the following:  Clinical Impression  Pt's oropharyngeal swallow is WFL. He has no penetration or aspiration and no oral or pharyngeal residue. Small suspected osteophytes are present but do not impede function. No SLP f/u indicated at this time. Pt was given a handout reggarding esophageal precautions and was encouraged to f/u with his GI doctor further.    Swallow Evaluation Recommendations       SLP Diet Recommendations: Regular solids;Thin liquid   Liquid Administration via: Cup;Straw   Medication Administration: Whole meds with liquid   Supervision: Patient able to self feed   Compensations: Slow rate;Small sips/bites   Postural Changes: Remain semi-upright after after feeds/meals (Comment);Seated upright at 90 degrees   Oral Care Recommendations: Oral care BID        [name] 11/03/2018,12:14 PM   Pollyann Glen, M.A. [place] Acute Environmental education officer [phone] Office [phone]

## 2018-11-06 NOTE — Progress Notes (Signed)
Since this is ok I think next step is to rescope and confirm if Eosinophilic esophagitis is in remission  If he is ok let us schedule that

## 2018-11-16 ENCOUNTER — Ambulatory Visit (AMBULATORY_SURGERY_CENTER): Payer: Self-pay | Admitting: *Deleted

## 2018-11-16 ENCOUNTER — Other Ambulatory Visit: Payer: Self-pay

## 2018-11-16 VITALS — Temp 97.5°F | Ht 69.0 in | Wt 230.6 lb

## 2018-11-16 DIAGNOSIS — K200 Eosinophilic esophagitis: Secondary | ICD-10-CM

## 2018-11-16 NOTE — Progress Notes (Signed)

## 2018-11-17 ENCOUNTER — Encounter: Payer: Self-pay | Admitting: Internal Medicine

## 2018-11-19 ENCOUNTER — Other Ambulatory Visit: Payer: Self-pay

## 2018-11-19 DIAGNOSIS — Z1159 Encounter for screening for other viral diseases: Secondary | ICD-10-CM

## 2018-11-24 LAB — SARS CORONAVIRUS 2 (TAT 6-24 HRS): SARS Coronavirus 2: NEGATIVE

## 2018-11-25 ENCOUNTER — Telehealth: Payer: Self-pay

## 2018-11-25 NOTE — Telephone Encounter (Signed)
Covid-19 screening questions   Do you now or have you had a fever in the last 14 days?  Do you have any respiratory symptoms of shortness of breath or cough now or in the last 14 days?  Do you have any family members or close contacts with diagnosed or suspected Covid-19 in the past 14 days?  Have you been tested for Covid-19 and found to be positive?       

## 2018-11-26 ENCOUNTER — Encounter: Payer: Self-pay | Admitting: Internal Medicine

## 2018-11-26 ENCOUNTER — Ambulatory Visit (AMBULATORY_SURGERY_CENTER): Payer: BC Managed Care – PPO | Admitting: Internal Medicine

## 2018-11-26 ENCOUNTER — Other Ambulatory Visit: Payer: Self-pay | Admitting: Internal Medicine

## 2018-11-26 ENCOUNTER — Other Ambulatory Visit: Payer: Self-pay

## 2018-11-26 VITALS — BP 118/79 | HR 75 | Temp 97.7°F | Resp 15 | Ht 69.0 in | Wt 230.0 lb

## 2018-11-26 DIAGNOSIS — K219 Gastro-esophageal reflux disease without esophagitis: Secondary | ICD-10-CM | POA: Diagnosis not present

## 2018-11-26 DIAGNOSIS — R1310 Dysphagia, unspecified: Secondary | ICD-10-CM | POA: Diagnosis not present

## 2018-11-26 DIAGNOSIS — K222 Esophageal obstruction: Secondary | ICD-10-CM

## 2018-11-26 DIAGNOSIS — K200 Eosinophilic esophagitis: Secondary | ICD-10-CM

## 2018-11-26 MED ORDER — SODIUM CHLORIDE 0.9 % IV SOLN: 500.0000 mL | INTRAVENOUS | Status: DC

## 2018-11-26 NOTE — Patient Instructions (Addendum)
I took the biopsies and dilated the esophagus.  Will call with results and plans.  I appreciate the opportunity to care for you. Gatha Mayer, MD, [facility]   Post dilation diet - clear liquids x 1 hour - until 1:15pm today.  Soft diet until tomorrow.  Await pathology results.  YOU HAD AN ENDOSCOPIC PROCEDURE TODAY AT [facility]:   Refer to the procedure report that was given to you for any specific questions about what was found during the examination.  If the procedure report does not answer your questions, please call your gastroenterologist to clarify.  If you requested that your care partner not be given the details of your procedure findings, then the procedure report has been included in a sealed envelope for you to review at your convenience later.  YOU SHOULD EXPECT: Some feelings of bloating in the abdomen. Passage of more gas than usual.  Walking can help get rid of the air that was put into your GI tract during the procedure and reduce the bloating. If you had a lower endoscopy (such as a colonoscopy or flexible sigmoidoscopy) you may notice spotting of blood in your stool or on the toilet paper. If you underwent a bowel prep for your procedure, you may not have a normal bowel movement for a few days.  Please Note:  You might notice some irritation and congestion in your nose or some drainage.  This is from the oxygen used during your procedure.  There is no need for concern and it should clear up in a day or so.  SYMPTOMS TO REPORT IMMEDIATELY:    Following upper endoscopy (EGD)  Vomiting of blood or coffee ground material  New chest pain or pain under the shoulder blades  Painful or persistently difficult swallowing  New shortness of breath  Fever of 100F or higher  Black, tarry-looking stools  For urgent or emergent issues, a gastroenterologist can be reached at any hour by calling [phone].   DIET:  We do recommend a small meal at first, but  then you may proceed to your regular diet.  Drink plenty of fluids but you should avoid alcoholic beverages for 24 hours.  ACTIVITY:  You should plan to take it easy for the rest of today and you should NOT DRIVE or use heavy machinery until tomorrow (because of the sedation medicines used during the test).    FOLLOW UP: Our staff will call the number listed on your records 48-72 hours following your procedure to check on you and address any questions or concerns that you may have regarding the information given to you following your procedure. If we do not reach you, we will leave a message.  We will attempt to reach you two times.  During this call, we will ask if you have developed any symptoms of COVID 19. If you develop any symptoms (ie: fever, flu-like symptoms, shortness of breath, cough etc.) before then, please call [phone].  If you test positive for Covid 19 in the 2 weeks post procedure, please call and report this information to [place].    If any biopsies were taken you will be contacted by phone or by letter within the next 1-3 weeks.  Please call us at [phone] if you have not heard about the biopsies in 3 weeks.    SIGNATURES/CONFIDENTIALITY: You and/or your care partner have signed paperwork which will be entered into your electronic medical record.  These signatures attest to the fact  that that the information above on your After Visit Summary has been reviewed and is understood.  Full responsibility of the confidentiality of this discharge information lies with you and/or your care-partner.

## 2018-11-26 NOTE — Op Note (Signed)
[facility] Patient Name: Nathan Ramsey Procedure Date: 11/26/2018 11:58 AM MRN: [id number] Endoscopist: Iva Boop , MD Age: 48 Referring MD:  Date of Birth: Jul 10, 1970 Gender: Male Account #: [id number] Procedure:                Upper GI endoscopy Indications:              Dysphagia, Esophageal reflux symptoms that persist                            despite appropriate therapy, Eosinophilic                            esophagitis, Follow-up of eosinophilic esophagitis,                            For therapy of eosinophilic esophagitis Medicines:                Propofol per Anesthesia, Monitored Anesthesia Care Procedure:                Pre-Anesthesia Assessment:                           - Prior to the procedure, a History and Physical                            was performed, and patient medications and                            allergies were reviewed. The patient's tolerance of                            previous anesthesia was also reviewed. The risks                            and benefits of the procedure and the sedation                            options and risks were discussed with the patient.                            All questions were answered, and informed consent                            was obtained. Prior Anticoagulants: The patient has                            taken no previous anticoagulant or antiplatelet                            agents. ASA Grade Assessment: II - A patient with                            mild systemic disease. After reviewing the risks  and benefits, the patient was deemed in                            satisfactory condition to undergo the procedure.                           After obtaining informed consent, the endoscope was                            passed under direct vision. Throughout the                            procedure, the patient's blood pressure, pulse, and     oxygen saturations were monitored continuously. The                            Endoscope was introduced through the mouth, and                            advanced to the second part of duodenum. The upper                            GI endoscopy was accomplished without difficulty.                            The patient tolerated the procedure well. Scope In: Scope Out: Findings:                 Mucosal changes including ringed esophagus and                            longitudinal furrows were found in the entire                            esophagus. Esophageal findings were graded using                            the Eosinophilic Esophagitis Endoscopic Reference                            Score (EoE-EREFS) as: Edema Grade 0 Normal                            (distinct vascular markings), Rings Grade 2                            Moderate (distinct rings that do not occlude                            passage of diagnostic 8-10 mm endoscope), Exudates                            Grade 0 None (no white lesions seen), Furrows Grade  1 Present (vertical lines with or without visible                            depth) and Stricture present. Biopsies were                            obtained from the proximal and distal esophagus                            with cold forceps for histology of suspected                            eosinophilic esophagitis. Verification of patient                            identification for the specimen was done. Estimated                            blood loss was minimal.                           The scope was withdrawn. Dilation was performed in                            the entire esophagus with a Maloney dilator with                            mild resistance at 54 Fr. Estimated blood loss:                            none.                           The cardia and gastric fundus were normal on                            retroflexion.                            The exam was otherwise without abnormality. Complications:            No immediate complications. Estimated Blood Loss:     Estimated blood loss was minimal. Impression:               - Esophageal mucosal changes secondary to                            eosinophilic esophagitis. Biopsied. Ring at GE                            junction dilated 54 Fr                           - The examination was otherwise normal.                           -  Dilation performed in the entire esophagus. Recommendation:           - Patient has a contact number available for                            emergencies. The signs and symptoms of potential                            delayed complications were discussed with the                            patient. Return to normal activities tomorrow.                            Written discharge instructions were provided to the                            patient.                           - Clear liquids x 1 hour then soft foods rest of                            day. Start prior diet tomorrow.                           - Continue present medications.                           - Await pathology results. Gatha Mayer, MD 11/26/2018 12:14:36 PM This report has been signed electronically.

## 2018-11-26 NOTE — Telephone Encounter (Signed)
Patient called back and answered "NO" to all screening questions. °

## 2018-11-26 NOTE — Progress Notes (Signed)
Called to room to assist during endoscopic procedure.  Patient ID and intended procedure confirmed with present staff. Received instructions for my participation in the procedure from the performing physician.  

## 2018-11-26 NOTE — Progress Notes (Signed)
Report given to PACU, vss 

## 2018-11-30 ENCOUNTER — Telehealth: Payer: Self-pay

## 2018-11-30 NOTE — Telephone Encounter (Signed)
  Follow up Call-  Call back number 11/26/2018  Post procedure Call Back phone  # [phone]  Permission to leave phone message Yes  Some recent data might be hidden     Patient questions:  Do you have a fever, pain , or abdominal swelling? No. Pain Score  0 *  Have you tolerated food without any problems? Yes.    Have you been able to return to your normal activities? Yes.    Do you have any questions about your discharge instructions: Diet   No. Medications  No. Follow up visit  No.  Do you have questions or concerns about your Care? No.  Actions: * If pain score is 4 or above: No action needed, pain <4.  1. Have you developed a fever since your procedure? no  2.   Have you had an respiratory symptoms (SOB or cough) since your procedure? no  3.   Have you tested positive for COVID 19 since your procedure no  4.   Have you had any family members/close contacts diagnosed with the COVID 19 since your procedure?  no   If yes to any of these questions please route to Joylene John, RN and Alphonsa Gin, Therapist, sports.

## 2018-12-09 NOTE — Progress Notes (Signed)
I called the patient.  I explained that his biopsies did not show eosinophilic esophagitis.  He had transient improvement in his nasal congestion and phlegm production after the EGD and dilation.  He sees Dr. Fredderick Phenix tomorrow.  I do not think this problem he is having is related to eosinophilic esophagitis to the best of my knowledge.  Will await Dr. Marjie Skiff input.  No recall or letter needed from the [facility]

## 2018-12-10 ENCOUNTER — Other Ambulatory Visit: Payer: Self-pay | Admitting: Allergy and Immunology

## 2018-12-10 ENCOUNTER — Ambulatory Visit
Admission: RE | Admit: 2018-12-10 | Discharge: 2018-12-10 | Disposition: A | Discharge status: Home / Self Care | Payer: BC Managed Care – PPO | Source: Ambulatory Visit | Attending: Allergy and Immunology | Admitting: Allergy and Immunology

## 2018-12-10 DIAGNOSIS — R059 Cough, unspecified: Secondary | ICD-10-CM

## 2018-12-10 DIAGNOSIS — R05 Cough: Secondary | ICD-10-CM

## 2019-01-03 IMAGING — DX DG CHEST 2V
2 series · 2 of 2 positions shown · non-contrast
Comparison: 06/19/2015

CLINICAL DATA: Chronic, wheezing for 1 year, cough, congestion

EXAM:
CHEST  2 VIEW

[dg chest 2 view (1 of 2)]
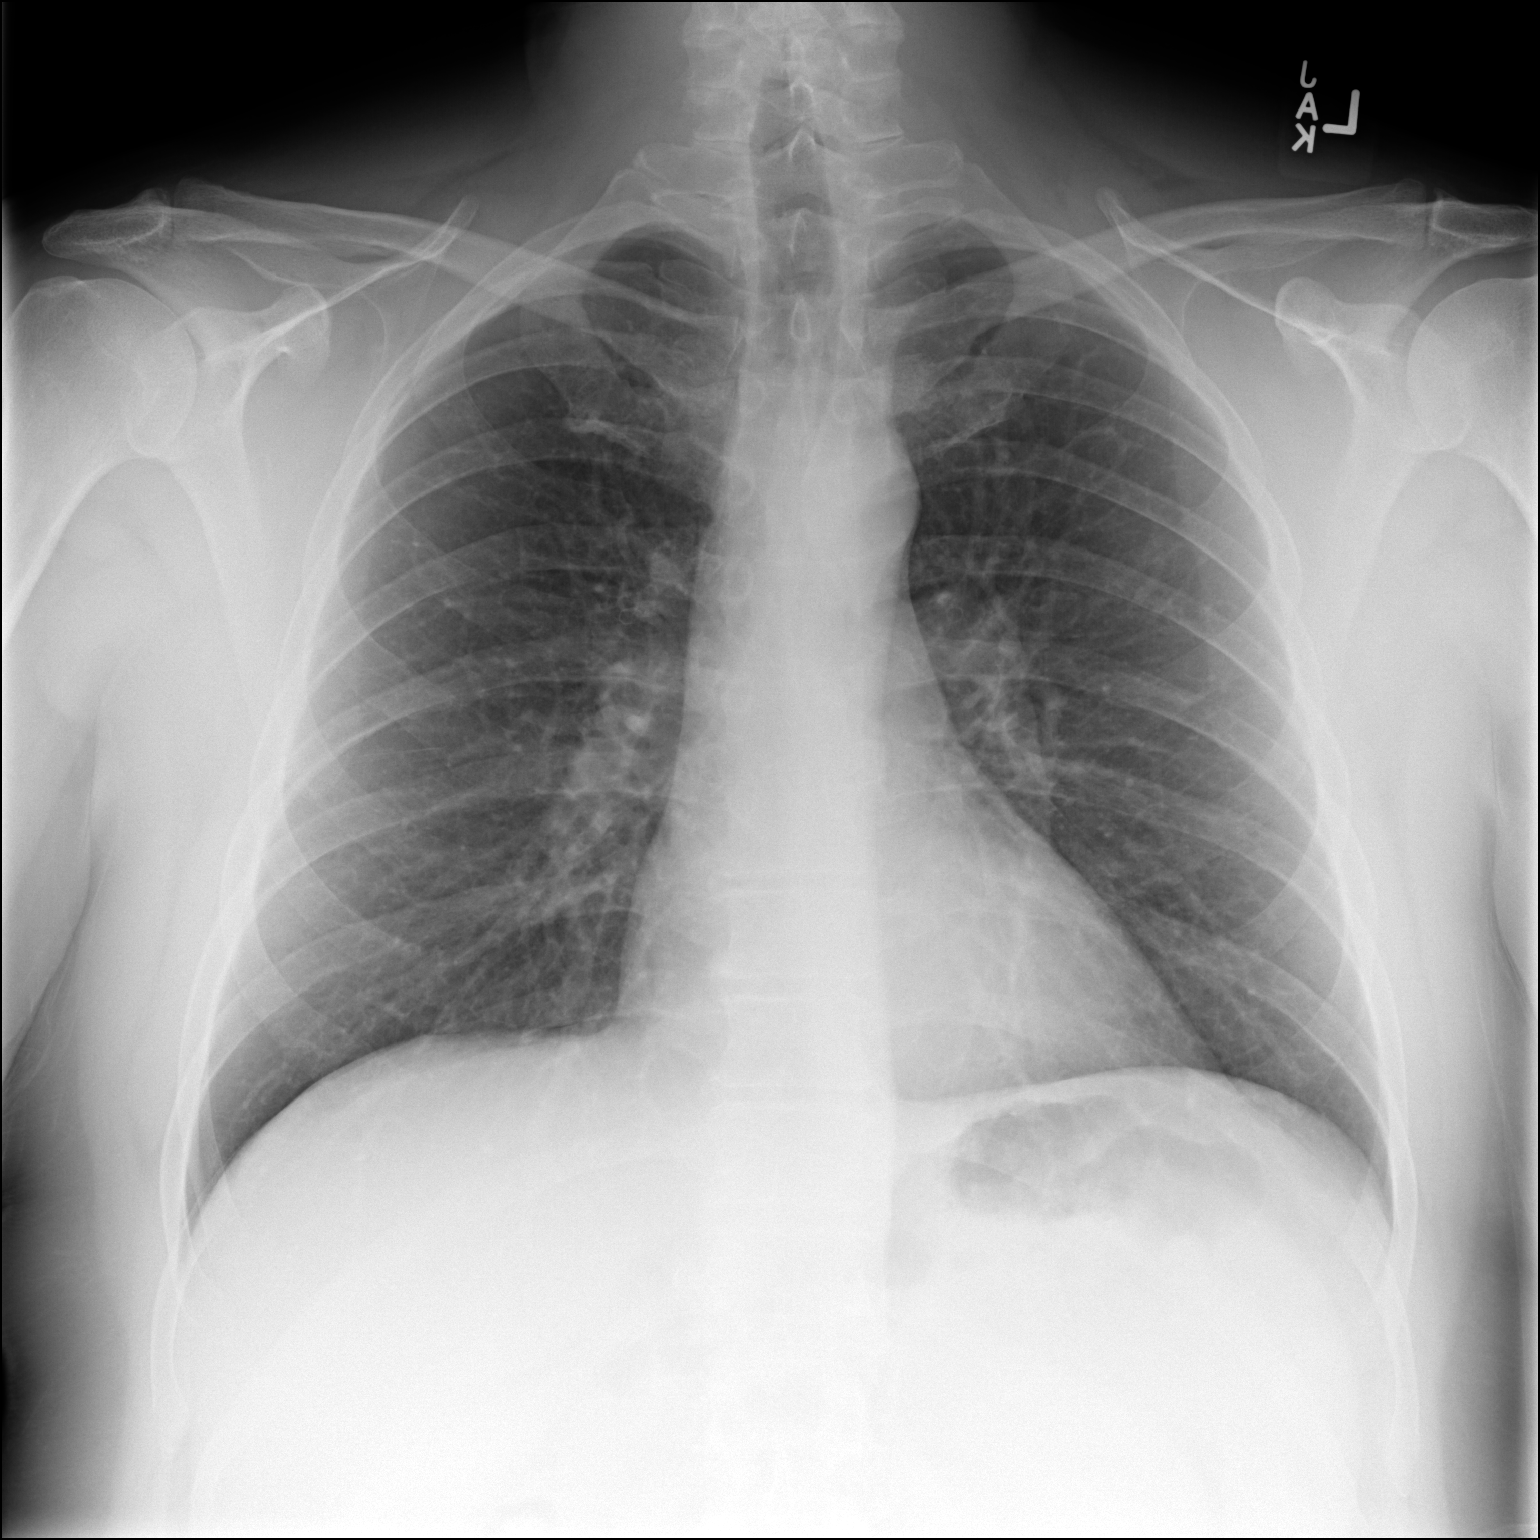

[dg chest 2 view (2 of 2)]
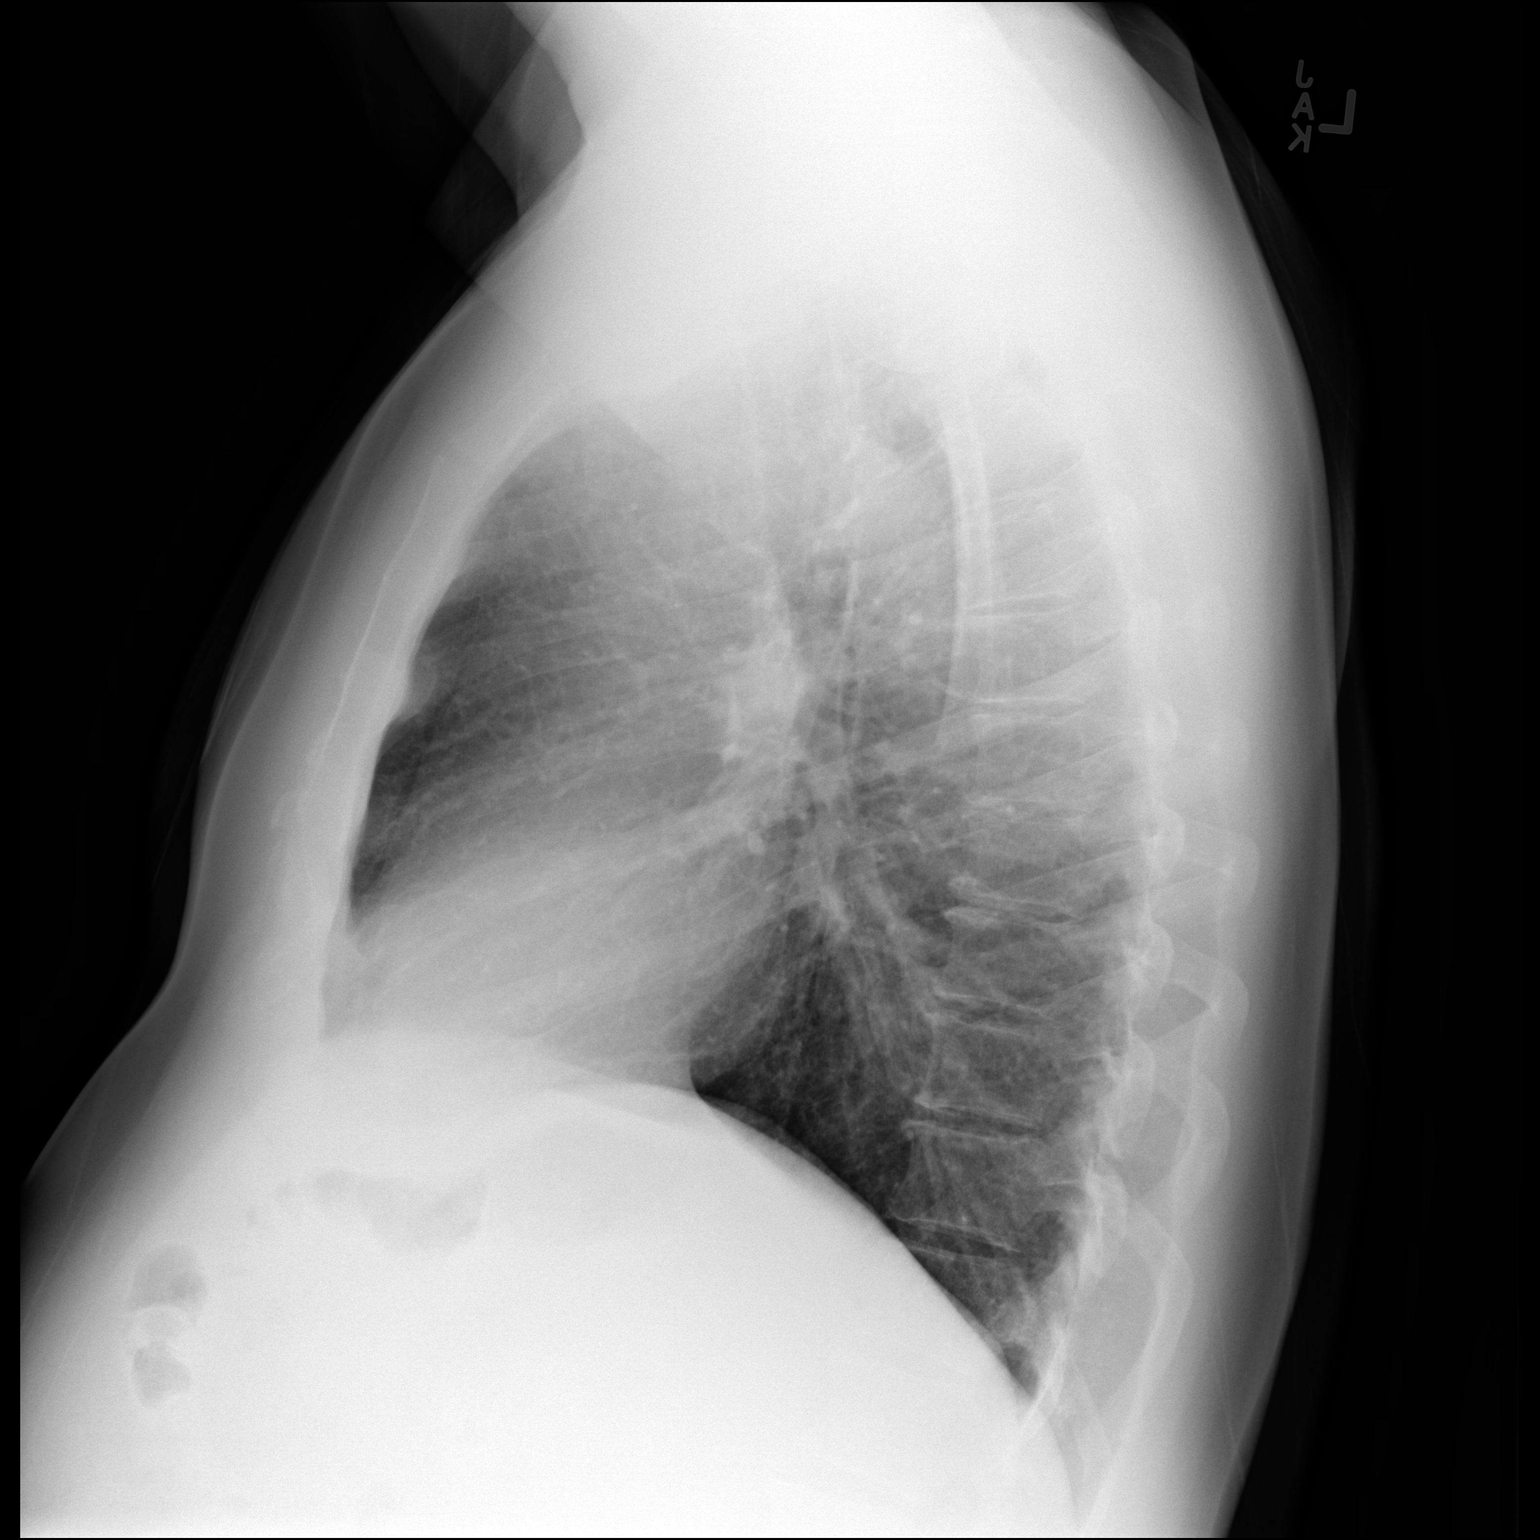

[2 of 2 positions shown; findings below may reference images not displayed]

FINDINGS: The heart size and mediastinal contours are within normal limits.
Both lungs are clear. The visualized skeletal structures are
unremarkable.
IMPRESSION: No active cardiopulmonary disease.

## 2019-05-10 MED ORDER — LANSOPRAZOLE 30 MG PO CPDR: 30.0000 mg | DELAYED_RELEASE_CAPSULE | Freq: Every day | ORAL | 0 refills | Status: DC

## 2019-07-28 ENCOUNTER — Other Ambulatory Visit: Payer: Self-pay | Admitting: Internal Medicine

## 2019-07-28 MED ORDER — LANSOPRAZOLE 30 MG PO CPDR: 30.0000 mg | DELAYED_RELEASE_CAPSULE | Freq: Every day | ORAL | 3 refills | Status: DC

## 2019-07-30 ENCOUNTER — Other Ambulatory Visit: Payer: Self-pay

## 2019-07-30 MED ORDER — LANSOPRAZOLE 30 MG PO CPDR: 30.0000 mg | DELAYED_RELEASE_CAPSULE | Freq: Every day | ORAL | 3 refills | Status: DC

## 2019-08-18 ENCOUNTER — Ambulatory Visit (INDEPENDENT_AMBULATORY_CARE_PROVIDER_SITE_OTHER): Payer: 59 | Admitting: Psychology

## 2019-08-18 DIAGNOSIS — F4323 Adjustment disorder with mixed anxiety and depressed mood: Secondary | ICD-10-CM

## 2019-08-27 ENCOUNTER — Ambulatory Visit (INDEPENDENT_AMBULATORY_CARE_PROVIDER_SITE_OTHER): Payer: 59 | Admitting: Psychology

## 2019-08-27 DIAGNOSIS — F4323 Adjustment disorder with mixed anxiety and depressed mood: Secondary | ICD-10-CM | POA: Diagnosis not present

## 2019-09-20 ENCOUNTER — Ambulatory Visit (INDEPENDENT_AMBULATORY_CARE_PROVIDER_SITE_OTHER): Payer: 59 | Admitting: Psychology

## 2019-09-20 DIAGNOSIS — F4323 Adjustment disorder with mixed anxiety and depressed mood: Secondary | ICD-10-CM | POA: Diagnosis not present

## 2019-09-30 ENCOUNTER — Ambulatory Visit (INDEPENDENT_AMBULATORY_CARE_PROVIDER_SITE_OTHER): Payer: 59 | Admitting: Psychology

## 2019-09-30 DIAGNOSIS — F4322 Adjustment disorder with anxiety: Secondary | ICD-10-CM

## 2019-10-14 ENCOUNTER — Ambulatory Visit (INDEPENDENT_AMBULATORY_CARE_PROVIDER_SITE_OTHER): Payer: 59 | Admitting: Psychology

## 2019-10-14 DIAGNOSIS — F4323 Adjustment disorder with mixed anxiety and depressed mood: Secondary | ICD-10-CM

## 2019-10-28 ENCOUNTER — Ambulatory Visit (INDEPENDENT_AMBULATORY_CARE_PROVIDER_SITE_OTHER): Payer: 59 | Admitting: Psychology

## 2019-10-28 DIAGNOSIS — F4323 Adjustment disorder with mixed anxiety and depressed mood: Secondary | ICD-10-CM | POA: Diagnosis not present

## 2019-11-11 ENCOUNTER — Ambulatory Visit (INDEPENDENT_AMBULATORY_CARE_PROVIDER_SITE_OTHER): Payer: 59 | Admitting: Psychology

## 2019-11-11 DIAGNOSIS — F4323 Adjustment disorder with mixed anxiety and depressed mood: Secondary | ICD-10-CM | POA: Diagnosis not present

## 2019-11-25 ENCOUNTER — Ambulatory Visit: Payer: 59 | Admitting: Psychology

## 2019-11-25 ENCOUNTER — Ambulatory Visit (INDEPENDENT_AMBULATORY_CARE_PROVIDER_SITE_OTHER): Payer: 59 | Admitting: Psychology

## 2019-11-25 DIAGNOSIS — F4323 Adjustment disorder with mixed anxiety and depressed mood: Secondary | ICD-10-CM | POA: Diagnosis not present

## 2019-12-09 ENCOUNTER — Ambulatory Visit (INDEPENDENT_AMBULATORY_CARE_PROVIDER_SITE_OTHER): Payer: 59 | Admitting: Psychology

## 2019-12-09 DIAGNOSIS — F4323 Adjustment disorder with mixed anxiety and depressed mood: Secondary | ICD-10-CM | POA: Diagnosis not present

## 2019-12-23 ENCOUNTER — Ambulatory Visit: Payer: 59 | Admitting: Psychology

## 2019-12-23 ENCOUNTER — Ambulatory Visit (INDEPENDENT_AMBULATORY_CARE_PROVIDER_SITE_OTHER): Payer: 59 | Admitting: Psychology

## 2019-12-23 DIAGNOSIS — F4323 Adjustment disorder with mixed anxiety and depressed mood: Secondary | ICD-10-CM | POA: Diagnosis not present

## 2020-03-03 ENCOUNTER — Ambulatory Visit (INDEPENDENT_AMBULATORY_CARE_PROVIDER_SITE_OTHER): Payer: BC Managed Care – PPO | Admitting: Psychology

## 2020-03-03 DIAGNOSIS — F4323 Adjustment disorder with mixed anxiety and depressed mood: Secondary | ICD-10-CM

## 2020-03-23 ENCOUNTER — Ambulatory Visit (INDEPENDENT_AMBULATORY_CARE_PROVIDER_SITE_OTHER): Payer: BC Managed Care – PPO | Admitting: Psychology

## 2020-03-23 DIAGNOSIS — F4323 Adjustment disorder with mixed anxiety and depressed mood: Secondary | ICD-10-CM | POA: Diagnosis not present

## 2020-04-06 ENCOUNTER — Ambulatory Visit (INDEPENDENT_AMBULATORY_CARE_PROVIDER_SITE_OTHER): Payer: BC Managed Care – PPO | Admitting: Psychology

## 2020-04-06 DIAGNOSIS — F4323 Adjustment disorder with mixed anxiety and depressed mood: Secondary | ICD-10-CM | POA: Diagnosis not present

## 2020-04-20 ENCOUNTER — Ambulatory Visit (INDEPENDENT_AMBULATORY_CARE_PROVIDER_SITE_OTHER): Payer: BC Managed Care – PPO | Admitting: Psychology

## 2020-04-20 DIAGNOSIS — F4323 Adjustment disorder with mixed anxiety and depressed mood: Secondary | ICD-10-CM | POA: Diagnosis not present

## 2020-05-04 ENCOUNTER — Ambulatory Visit (INDEPENDENT_AMBULATORY_CARE_PROVIDER_SITE_OTHER): Payer: BC Managed Care – PPO | Admitting: Psychology

## 2020-05-04 DIAGNOSIS — F4323 Adjustment disorder with mixed anxiety and depressed mood: Secondary | ICD-10-CM | POA: Diagnosis not present

## 2020-05-18 ENCOUNTER — Ambulatory Visit (INDEPENDENT_AMBULATORY_CARE_PROVIDER_SITE_OTHER): Payer: BC Managed Care – PPO | Admitting: Psychology

## 2020-05-18 DIAGNOSIS — F4323 Adjustment disorder with mixed anxiety and depressed mood: Secondary | ICD-10-CM

## 2020-06-05 ENCOUNTER — Ambulatory Visit (INDEPENDENT_AMBULATORY_CARE_PROVIDER_SITE_OTHER): Payer: BC Managed Care – PPO | Admitting: Psychology

## 2020-06-05 DIAGNOSIS — F4323 Adjustment disorder with mixed anxiety and depressed mood: Secondary | ICD-10-CM

## 2020-06-26 ENCOUNTER — Ambulatory Visit: Payer: BC Managed Care – PPO | Admitting: Psychology

## 2020-07-19 ENCOUNTER — Other Ambulatory Visit: Payer: Self-pay | Admitting: Internal Medicine

## 2020-10-28 IMAGING — DX DG CHEST 2V
2 series · 2 of 2 positions shown · non-contrast
Comparison: 02/14/2017

CLINICAL DATA: Cough

EXAM:
CHEST - 2 VIEW

[dg chest 2 view (1 of 2)]
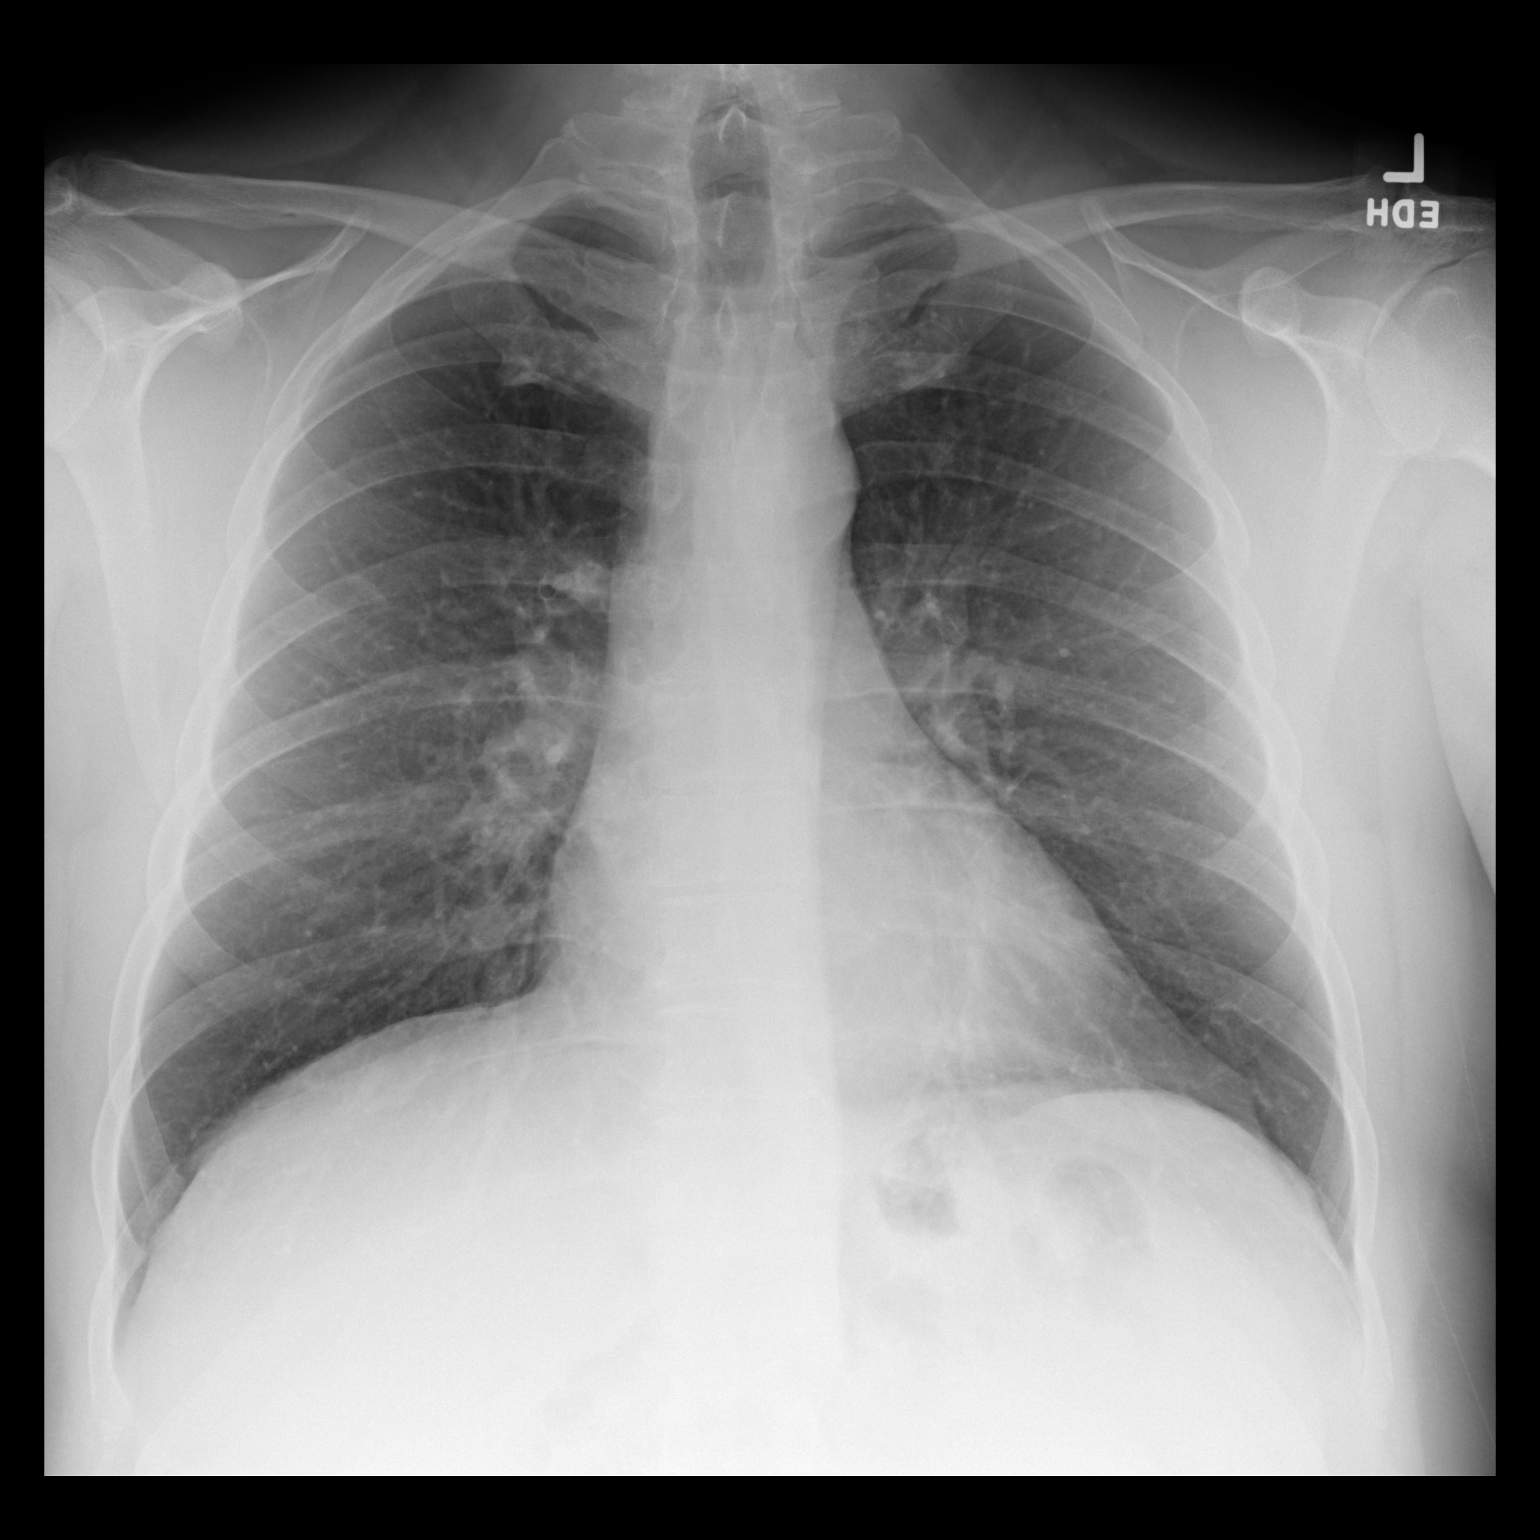

[dg chest 2 view (2 of 2)]
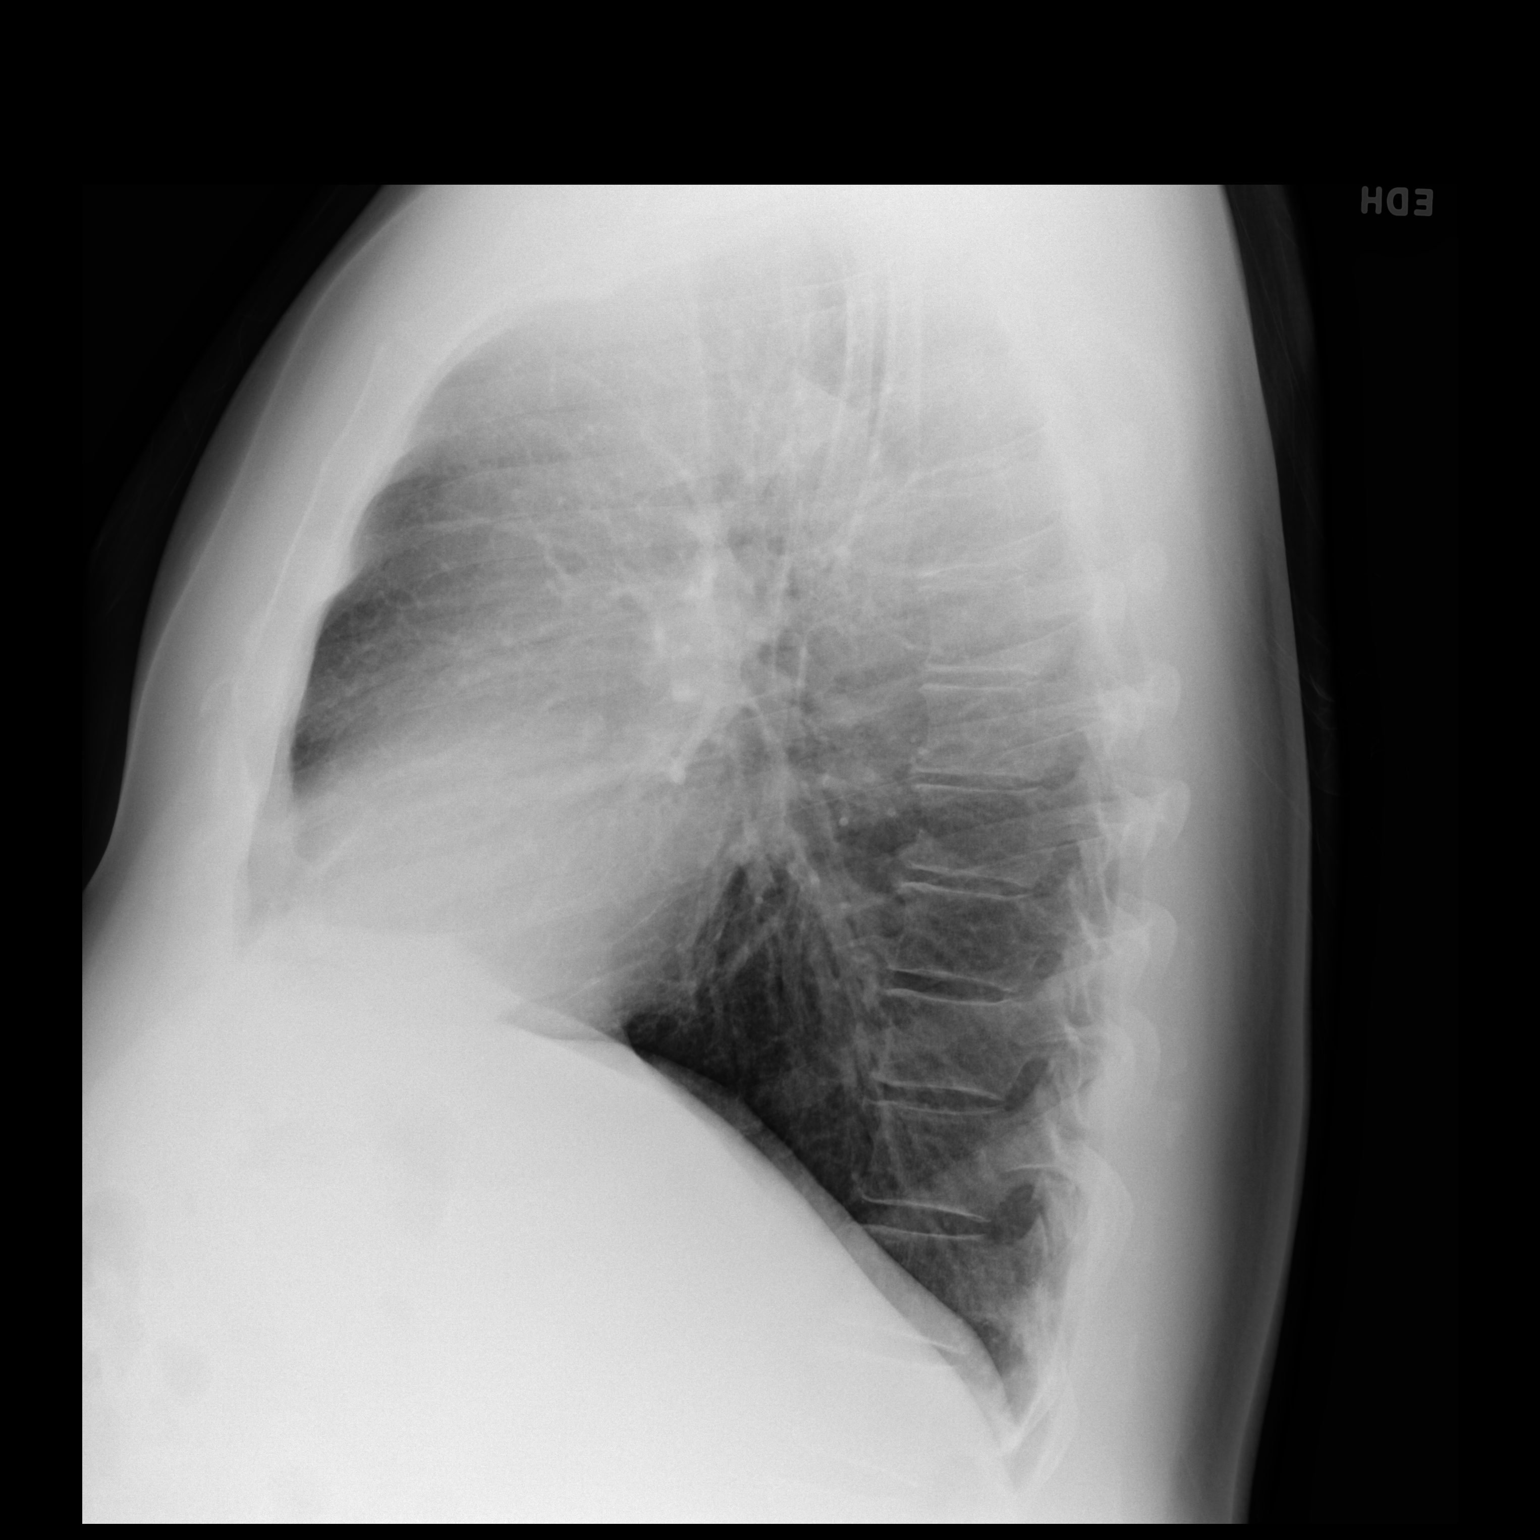

[2 of 2 positions shown; findings below may reference images not displayed]

FINDINGS: The heart size and mediastinal contours are within normal limits.
Both lungs are clear. The visualized skeletal structures are
unremarkable.
IMPRESSION: No acute abnormality of the lungs.

## 2021-07-12 ENCOUNTER — Other Ambulatory Visit: Payer: Self-pay | Admitting: Internal Medicine

## 2021-07-22 ENCOUNTER — Other Ambulatory Visit: Payer: Self-pay | Admitting: Internal Medicine

## 2021-10-23 ENCOUNTER — Other Ambulatory Visit: Payer: Self-pay | Admitting: Internal Medicine

## 2022-01-20 ENCOUNTER — Other Ambulatory Visit: Payer: Self-pay | Admitting: Internal Medicine

## 2022-04-24 ENCOUNTER — Other Ambulatory Visit: Payer: Self-pay | Admitting: Internal Medicine

## 2022-07-19 ENCOUNTER — Other Ambulatory Visit: Payer: Self-pay | Admitting: Internal Medicine

## 2022-07-25 ENCOUNTER — Other Ambulatory Visit: Payer: Self-pay | Admitting: Internal Medicine

## 2022-07-25 ENCOUNTER — Telehealth: Payer: Self-pay | Admitting: Internal Medicine

## 2022-07-25 MED ORDER — LANSOPRAZOLE 30 MG PO CPDR: 30.0000 mg | DELAYED_RELEASE_CAPSULE | Freq: Every day | ORAL | 0 refills | Status: DC

## 2022-07-25 NOTE — Telephone Encounter (Signed)
Inbound call from patient stating he is in need for a medication refill for Lansoprazole. Patient has scheduled OV as directed. Please advise.

## 2022-07-25 NOTE — Telephone Encounter (Signed)
I called [facility] to confirm his pharmacy and sent in the lansoprazole.

## 2022-09-09 ENCOUNTER — Ambulatory Visit: Payer: BC Managed Care – PPO | Admitting: Gastroenterology

## 2022-10-22 ENCOUNTER — Other Ambulatory Visit: Payer: Self-pay | Admitting: Internal Medicine

## 2022-10-29 NOTE — Progress Notes (Unsigned)
10/30/2022 Nathan Ramsey [id number] 26-May-1970  Referring provider: Truett Perna, MD Primary GI doctor: Dr. Leone Payor  ASSESSMENT AND PLAN:   Eosinophilic esophagitis with LPR Last EGD 2020 s/p dilation Has been on lansoprazole once daily without any issues Refilled for a year Recall OV in 2 years unless patient has any issues.  Lifestyle changes discussed, avoid NSAIDS, ETOH Weight loss discussed with the patient    Patient Care Team: Truett Perna, MD as PCP - General (Internal Medicine)  HISTORY OF PRESENT ILLNESS: 52 y.o. male with a past medical history of LPR, EOE, and others listed below presents for evaluation of GERD.   11/26/2018 EGD - Esophageal mucosal changes secondary to eosinophilic esophagitis. Biopsied. Ring at GE junction dilated 54 Fr - The examination was otherwise normal. - Dilation performed in the entire esophagus. 2020 OV with Dr. Leone Payor  07/19/2020 colonoscopy with [facility], medium internal hemorrhoids, normal colon, adequate prep 06/14/22 CBC without anemia  Patient has a BM every day.  Patient denies change in bowel habits, constipation, diarrhea, hematochezia.  Denis AB pain or bloating.  Denies changes in appetite, unintentional weight loss.  Patient reports GERD well controlled with lansoprazole once dialy 30 mins before food.  He is getting allergy shots with [facility].  He states he will have swelling of lips/tongue, he will take benadryl and that will resolve it but if not then he will go to the ER, last time was 2019, this started in 2015. No rashes.  Patient denies dysphagia, nausea, vomiting, melena.   He denies blood thinner use.  He denies NSAID use.  He reports ETOH use, 1-2 beer a day.   He denies tobacco use.  He denies drug use.    He  reports that he has never smoked. His smokeless tobacco use includes snuff. He reports current alcohol use of about 12.0 - 13.0 standard drinks of alcohol per week. He reports that he does  not use drugs.  RELEVANT LABS AND IMAGING: CBC    Component Value Date/Time   WBC 11.0 (H) 11/22/2012 2032   RBC 4.95 11/22/2012 2032   HGB 15.7 11/22/2012 2032   HCT 45.8 11/22/2012 2032   PLT 308 11/22/2012 2032   MCV 92.5 11/22/2012 2032   MCH 31.7 11/22/2012 2032   MCHC 34.3 11/22/2012 2032   RDW 13.3 11/22/2012 2032   LYMPHSABS 4.0 11/22/2012 2032   MONOABS 1.0 11/22/2012 2032   EOSABS 0.6 11/22/2012 2032   BASOSABS 0.1 11/22/2012 2032   No results for input(s): "HGB" in the last 8760 hours.  CMP     Component Value Date/Time   NA 141 11/22/2012 2032   K 3.7 11/22/2012 2032   CL 104 11/22/2012 2032   CO2 26 11/22/2012 2032   GLUCOSE 107 (H) 11/22/2012 2032   BUN 13 11/22/2012 2032   CREATININE 1.10 11/22/2012 2032   CALCIUM 9.6 11/22/2012 2032   GFRNONAA 81 (L) 11/22/2012 2032   GFRAA >90 11/22/2012 2032       No data to display            Current Medications:   Current Outpatient Medications (Endocrine & Metabolic):    metFORMIN (GLUCOPHAGE) 500 MG tablet, Take 500 mg by mouth.  Current Outpatient Medications (Cardiovascular):    amLODipine (NORVASC) 10 MG tablet, Take 10 mg by mouth daily.   atorvastatin (LIPITOR) 80 MG tablet, Take 1 tablet by mouth daily.   EPIPEN 2-PAK 0.3 MG/0.3ML SOAJ injection, Reported on 07/06/2015  hydrochlorothiazide (HYDRODIURIL) 25 MG tablet, Take 25 mg by mouth daily.  Current Outpatient Medications (Respiratory):    albuterol (PROVENTIL) (2.5 MG/3ML) 0.083% nebulizer solution, Inhale into the lungs.   cetirizine (ZYRTEC) 10 MG tablet, Take 10 mg by mouth 2 (two) times daily.    Current Outpatient Medications (Other):    AMBULATORY NON FORMULARY MEDICATION, Medication Name: OTC allergy Medication (Allertec)-Take 1 tablet PO daily   escitalopram (LEXAPRO) 10 MG tablet, Take 10 mg by mouth daily.   lansoprazole (PREVACID) 30 MG capsule, Take 1 capsule (30 mg total) by mouth daily before breakfast.  Medical History:   Past Medical History:  Diagnosis Date   Allergy    Angioedema 01/26/2013   Diabetes (HCC)    Distal esophageal obstruction due to food 11/22/2012   Eosinophilic esophagitis 11/23/2012   GERD (gastroesophageal reflux disease)    Hyperlipidemia    Hypertension    OSA on CPAP 10/30/2022   Seasonal allergies    Allergies: No Known Allergies   Surgical History:  He  has a past surgical history that includes Esophageal dilation; Esophagogastroduodenoscopy (N/A, 11/22/2012); Knee arthroscopy; and Upper gastrointestinal endoscopy. Family History:  His family history includes Breast cancer in his mother; Kidney cancer in his daughter; Melanoma in his paternal uncle.  REVIEW OF SYSTEMS  : All other systems reviewed and negative except where noted in the History of Present Illness.  PHYSICAL EXAM: BP 122/74   Pulse 98   Ht 5\' 10"  (1.778 m)   Wt 224 lb 5 oz (101.7 kg)   BMI 32.19 kg/m  General Appearance: Well nourished, in no apparent distress. Head:   Normocephalic and atraumatic. Eyes:  sclerae anicteric,conjunctive pink  Respiratory: Respiratory effort normal, BS equal bilaterally without rales, rhonchi, wheezing. Cardio: RRR with no MRGs. Peripheral pulses intact.  Abdomen: Soft,  Obese ,active bowel sounds. No tenderness . Without guarding and Without rebound. No masses. Rectal: Not evaluated Musculoskeletal: Full ROM, Normal gait. Without edema. Skin:  Dry and intact without significant lesions , vitiligo on bilateral hands and knees Neuro: Alert and  oriented x4;  No focal deficits. Psych:  Cooperative. Normal mood and affect.    Doree Albee, PA-C 9:52 AM

## 2022-10-30 ENCOUNTER — Encounter: Payer: Self-pay | Admitting: Physician Assistant

## 2022-10-30 ENCOUNTER — Ambulatory Visit (INDEPENDENT_AMBULATORY_CARE_PROVIDER_SITE_OTHER): Payer: Commercial Managed Care - PPO | Admitting: Physician Assistant

## 2022-10-30 VITALS — BP 122/74 | HR 98 | Ht 70.0 in | Wt 224.3 lb

## 2022-10-30 DIAGNOSIS — K219 Gastro-esophageal reflux disease without esophagitis: Secondary | ICD-10-CM

## 2022-10-30 DIAGNOSIS — K200 Eosinophilic esophagitis: Secondary | ICD-10-CM

## 2022-10-30 DIAGNOSIS — G4733 Obstructive sleep apnea (adult) (pediatric): Secondary | ICD-10-CM

## 2022-10-30 HISTORY — DX: Obstructive sleep apnea (adult) (pediatric): G47.33

## 2022-10-30 MED ORDER — LANSOPRAZOLE 30 MG PO CPDR: 30.0000 mg | DELAYED_RELEASE_CAPSULE | Freq: Every day | ORAL | 3 refills | Status: DC

## 2022-10-30 NOTE — Patient Instructions (Addendum)
Please take your proton pump inhibitor medication, lansoprazole  Please take this medication 30 minutes to 1 hour before meals- this makes it more effective.  Avoid spicy and acidic foods Avoid fatty foods Limit your intake of coffee, tea, alcohol, and carbonated drinks Work to maintain a healthy weight Keep the head of the bed elevated at least 3 inches with blocks or a wedge pillow if you are having any nighttime symptoms Stay upright for 2 hours after eating Avoid meals and snacks three to four hours before bedtime  We have put a recall in for you for a follow up in 2 years.  _______________________________________________________  If your blood pressure at your visit was 140/90 or greater, please contact your primary care physician to follow up on this.  _______________________________________________________  If you are age 86 or older, your body mass index should be between 23-30. Your Body mass index is 32.19 kg/m. If this is out of the aforementioned range listed, please consider follow up with your Primary Care Provider.  If you are age 22 or younger, your body mass index should be between 19-25. Your Body mass index is 32.19 kg/m. If this is out of the aformentioned range listed, please consider follow up with your Primary Care Provider.   ________________________________________________________  The [facility] providers would like to encourage you to use [facility] to communicate with providers for non-urgent requests or questions.  Due to long hold times on the telephone, sending your provider a message by [facility] may be a faster and more efficient way to get a response.  Please allow 48 business hours for a response.  Please remember that this is for non-urgent requests.  _______________________________________________________  It was a pleasure to see you today!  Thank you for trusting me with your gastrointestinal care!

## 2023-01-29 ENCOUNTER — Other Ambulatory Visit: Payer: Self-pay | Admitting: Internal Medicine

## 2024-01-20 ENCOUNTER — Other Ambulatory Visit: Payer: Self-pay | Admitting: Physician Assistant
